# Patient Record
Sex: Female | Born: 1970 | Race: Black or African American | Hispanic: No | Marital: Married | Smoking: Former smoker
Health system: Southern US, Community
[De-identification: ages and names within clinical notes are randomized; demographics above are authoritative.]

## PROBLEM LIST (undated history)

## (undated) DIAGNOSIS — I5021 Acute systolic (congestive) heart failure: Secondary | ICD-10-CM

## (undated) DIAGNOSIS — Z720 Tobacco use: Secondary | ICD-10-CM

## (undated) DIAGNOSIS — F1010 Alcohol abuse, uncomplicated: Secondary | ICD-10-CM

---

## 2005-04-23 ENCOUNTER — Ambulatory Visit: Payer: Self-pay | Admitting: Family Medicine

## 2005-05-07 ENCOUNTER — Other Ambulatory Visit: Admission: RE | Admit: 2005-05-07 | Discharge: 2005-05-07 | Payer: Self-pay | Admitting: Family Medicine

## 2005-05-07 ENCOUNTER — Ambulatory Visit: Payer: Self-pay | Admitting: Family Medicine

## 2005-08-12 ENCOUNTER — Ambulatory Visit: Payer: Self-pay | Admitting: Family Medicine

## 2019-06-16 DIAGNOSIS — I426 Alcoholic cardiomyopathy: Secondary | ICD-10-CM

## 2019-06-16 DIAGNOSIS — I5021 Acute systolic (congestive) heart failure: Secondary | ICD-10-CM | POA: Diagnosis not present

## 2019-06-16 DIAGNOSIS — I340 Nonrheumatic mitral (valve) insufficiency: Secondary | ICD-10-CM

## 2019-06-16 DIAGNOSIS — I509 Heart failure, unspecified: Secondary | ICD-10-CM

## 2019-06-16 DIAGNOSIS — I110 Hypertensive heart disease with heart failure: Secondary | ICD-10-CM

## 2019-06-16 DIAGNOSIS — I361 Nonrheumatic tricuspid (valve) insufficiency: Secondary | ICD-10-CM

## 2019-06-16 DIAGNOSIS — K7010 Alcoholic hepatitis without ascites: Secondary | ICD-10-CM

## 2019-06-17 DIAGNOSIS — I10 Essential (primary) hypertension: Secondary | ICD-10-CM | POA: Diagnosis not present

## 2019-06-17 DIAGNOSIS — I509 Heart failure, unspecified: Secondary | ICD-10-CM

## 2019-06-17 DIAGNOSIS — I426 Alcoholic cardiomyopathy: Secondary | ICD-10-CM

## 2019-06-17 DIAGNOSIS — I110 Hypertensive heart disease with heart failure: Secondary | ICD-10-CM | POA: Diagnosis not present

## 2019-06-17 DIAGNOSIS — I5021 Acute systolic (congestive) heart failure: Secondary | ICD-10-CM | POA: Diagnosis not present

## 2019-06-18 ENCOUNTER — Inpatient Hospital Stay (HOSPITAL_COMMUNITY)
Admission: AD | Admit: 2019-06-18 | Discharge: 2019-06-22 | DRG: 287 | Disposition: A | Discharge status: Home / Self Care | Payer: Commercial Managed Care - PPO | Source: Other Acute Inpatient Hospital | Attending: Internal Medicine | Admitting: Internal Medicine

## 2019-06-18 ENCOUNTER — Inpatient Hospital Stay: Payer: Self-pay

## 2019-06-18 ENCOUNTER — Encounter (HOSPITAL_COMMUNITY): Payer: Self-pay | Admitting: Internal Medicine

## 2019-06-18 DIAGNOSIS — I5021 Acute systolic (congestive) heart failure: Secondary | ICD-10-CM | POA: Diagnosis not present

## 2019-06-18 DIAGNOSIS — I10 Essential (primary) hypertension: Secondary | ICD-10-CM

## 2019-06-18 DIAGNOSIS — F1021 Alcohol dependence, in remission: Secondary | ICD-10-CM | POA: Diagnosis present

## 2019-06-18 DIAGNOSIS — Z23 Encounter for immunization: Secondary | ICD-10-CM | POA: Diagnosis not present

## 2019-06-18 DIAGNOSIS — I426 Alcoholic cardiomyopathy: Secondary | ICD-10-CM

## 2019-06-18 DIAGNOSIS — Z20828 Contact with and (suspected) exposure to other viral communicable diseases: Secondary | ICD-10-CM | POA: Diagnosis present

## 2019-06-18 DIAGNOSIS — I110 Hypertensive heart disease with heart failure: Secondary | ICD-10-CM | POA: Diagnosis present

## 2019-06-18 DIAGNOSIS — I428 Other cardiomyopathies: Secondary | ICD-10-CM | POA: Diagnosis present

## 2019-06-18 DIAGNOSIS — I340 Nonrheumatic mitral (valve) insufficiency: Secondary | ICD-10-CM | POA: Diagnosis not present

## 2019-06-18 DIAGNOSIS — I509 Heart failure, unspecified: Secondary | ICD-10-CM

## 2019-06-18 DIAGNOSIS — I361 Nonrheumatic tricuspid (valve) insufficiency: Secondary | ICD-10-CM | POA: Diagnosis not present

## 2019-06-18 DIAGNOSIS — Z87891 Personal history of nicotine dependence: Secondary | ICD-10-CM

## 2019-06-18 DIAGNOSIS — R188 Other ascites: Secondary | ICD-10-CM | POA: Diagnosis present

## 2019-06-18 DIAGNOSIS — I5023 Acute on chronic systolic (congestive) heart failure: Secondary | ICD-10-CM | POA: Diagnosis present

## 2019-06-18 DIAGNOSIS — R0602 Shortness of breath: Secondary | ICD-10-CM | POA: Diagnosis present

## 2019-06-18 HISTORY — DX: Acute systolic (congestive) heart failure: I50.21

## 2019-06-18 HISTORY — DX: Tobacco use: Z72.0

## 2019-06-18 HISTORY — DX: Alcohol abuse, uncomplicated: F10.10

## 2019-06-18 LAB — CBC
HCT: 45.8 % (ref 36.0–46.0)
Hemoglobin: 15.3 g/dL — ABNORMAL HIGH (ref 12.0–15.0)
MCH: 30.5 pg (ref 26.0–34.0)
MCHC: 33.4 g/dL (ref 30.0–36.0)
MCV: 91.4 fL (ref 80.0–100.0)
Platelets: 293 10*3/uL (ref 150–400)
RBC: 5.01 MIL/uL (ref 3.87–5.11)
RDW: 14.6 % (ref 11.5–15.5)
WBC: 9.3 10*3/uL (ref 4.0–10.5)
nRBC: 0 % (ref 0.0–0.2)

## 2019-06-18 LAB — TSH: TSH: 0.933 u[IU]/mL (ref 0.350–4.500)

## 2019-06-18 LAB — BRAIN NATRIURETIC PEPTIDE: B Natriuretic Peptide: 2160.9 pg/mL — ABNORMAL HIGH (ref 0.0–100.0)

## 2019-06-18 LAB — BASIC METABOLIC PANEL
Anion gap: 10 (ref 5–15)
BUN: 21 mg/dL — ABNORMAL HIGH (ref 6–20)
CO2: 22 mmol/L (ref 22–32)
Calcium: 8.9 mg/dL (ref 8.9–10.3)
Chloride: 107 mmol/L (ref 98–111)
Creatinine, Ser: 1.05 mg/dL — ABNORMAL HIGH (ref 0.44–1.00)
GFR calc Af Amer: >60 mL/min (ref >60)
GFR calc non Af Amer: >60 mL/min (ref >60)
Glucose, Bld: 111 mg/dL — ABNORMAL HIGH (ref 70–99)
Potassium: 3.7 mmol/L (ref 3.5–5.1)
Sodium: 139 mmol/L (ref 135–145)

## 2019-06-18 LAB — MAGNESIUM: Magnesium: 1.9 mg/dL (ref 1.7–2.4)

## 2019-06-18 LAB — MRSA PCR SCREENING: MRSA by PCR: NEGATIVE

## 2019-06-18 MED ORDER — SODIUM CHLORIDE 0.9% FLUSH: 10.0000 mL | INTRAVENOUS | Status: DC | PRN

## 2019-06-18 MED ORDER — SODIUM CHLORIDE 0.9% FLUSH: 3.0000 mL | Freq: Two times a day (BID) | INTRAVENOUS | Status: DC

## 2019-06-18 MED ORDER — ASPIRIN 81 MG PO CHEW
81.0000 mg | CHEWABLE_TABLET | ORAL | Status: AC
  Administered 2019-06-19: 81 mg via ORAL
  Filled 2019-06-18: qty 1

## 2019-06-18 MED ORDER — FUROSEMIDE 10 MG/ML IJ SOLN
60.0000 mg | Freq: Two times a day (BID) | INTRAMUSCULAR | Status: DC
  Administered 2019-06-18 – 2019-06-19 (×3): 60 mg via INTRAVENOUS
  Filled 2019-06-18 (×3): qty 6

## 2019-06-18 MED ORDER — ADULT MULTIVITAMIN W/MINERALS CH
1.0000 | ORAL_TABLET | Freq: Every day | ORAL | Status: DC
  Administered 2019-06-19 – 2019-06-22 (×4): 1 via ORAL
  Filled 2019-06-18 (×4): qty 1

## 2019-06-18 MED ORDER — SODIUM CHLORIDE 0.9 % IV SOLN: 250.0000 mL | INTRAVENOUS | Status: DC | PRN

## 2019-06-18 MED ORDER — ENOXAPARIN SODIUM 40 MG/0.4ML ~~LOC~~ SOLN
40.0000 mg | SUBCUTANEOUS | Status: DC
  Administered 2019-06-18 – 2019-06-19 (×2): 40 mg via SUBCUTANEOUS
  Filled 2019-06-18 (×2): qty 0.4

## 2019-06-18 MED ORDER — SPIRONOLACTONE 25 MG PO TABS
25.0000 mg | ORAL_TABLET | Freq: Every day | ORAL | Status: DC
  Administered 2019-06-19 – 2019-06-22 (×4): 25 mg via ORAL
  Filled 2019-06-18 (×4): qty 1

## 2019-06-18 MED ORDER — SODIUM CHLORIDE 0.9% FLUSH
10.0000 mL | Freq: Two times a day (BID) | INTRAVENOUS | Status: DC
  Administered 2019-06-18: 30 mL
  Administered 2019-06-19 – 2019-06-20 (×2): 10 mL
  Administered 2019-06-20: 30 mL
  Administered 2019-06-21: 10 mL
  Administered 2019-06-21 – 2019-06-22 (×2): 30 mL

## 2019-06-18 MED ORDER — CHLORHEXIDINE GLUCONATE CLOTH 2 % EX PADS
6.0000 | MEDICATED_PAD | Freq: Every day | CUTANEOUS | Status: DC
  Administered 2019-06-18 – 2019-06-19 (×2): 6 via TOPICAL

## 2019-06-18 MED ORDER — SACUBITRIL-VALSARTAN 24-26 MG PO TABS
1.0000 | ORAL_TABLET | Freq: Two times a day (BID) | ORAL | Status: DC
  Administered 2019-06-18: 1 via ORAL
  Filled 2019-06-18 (×3): qty 1

## 2019-06-18 MED ORDER — SODIUM CHLORIDE 0.9 % IV SOLN: INTRAVENOUS | Status: DC

## 2019-06-18 MED ORDER — ACETAMINOPHEN 325 MG PO TABS
650.0000 mg | ORAL_TABLET | ORAL | Status: DC | PRN
  Administered 2019-06-19: 650 mg via ORAL
  Filled 2019-06-18: qty 2

## 2019-06-18 MED ORDER — SODIUM CHLORIDE 0.9% FLUSH: 3.0000 mL | INTRAVENOUS | Status: DC | PRN

## 2019-06-18 MED ORDER — ONDANSETRON HCL 4 MG/2ML IJ SOLN: 4.0000 mg | Freq: Four times a day (QID) | INTRAMUSCULAR | Status: DC | PRN

## 2019-06-18 MED ORDER — DIGOXIN 125 MCG PO TABS
0.1250 mg | ORAL_TABLET | Freq: Every day | ORAL | Status: DC
  Administered 2019-06-19 – 2019-06-22 (×4): 0.125 mg via ORAL
  Filled 2019-06-18 (×4): qty 1

## 2019-06-18 MED ORDER — ASPIRIN EC 81 MG PO TBEC
81.0000 mg | DELAYED_RELEASE_TABLET | Freq: Every day | ORAL | Status: DC
  Administered 2019-06-19 – 2019-06-21 (×3): 81 mg via ORAL
  Filled 2019-06-18 (×4): qty 1

## 2019-06-18 NOTE — Progress Notes (Signed)
Peripherally Inserted Central Catheter/Midline Placement  The IV Nurse has discussed with the patient and/or persons authorized to consent for the patient, the purpose of this procedure and the potential benefits and risks involved with this procedure.  The benefits include less needle sticks, lab draws from the catheter, and the patient may be discharged home with the catheter. Risks include, but not limited to, infection, bleeding, blood clot (thrombus formation), and puncture of an artery; nerve damage and irregular heartbeat and possibility to perform a PICC exchange if needed/ordered by physician.  Alternatives to this procedure were also discussed.  Bard Power PICC patient education guide, fact sheet on infection prevention and patient information card has been provided to patient /or left at bedside.    PICC/Midline Placement Documentation  PICC Triple Lumen 06/18/19 PICC Right Brachial 39 cm 2 cm (Active)  Indication for Insertion or Continuance of Line Vasoactive infusions 06/18/19 2032  Exposed Catheter (cm) 0 cm 06/18/19 2032  Site Assessment Clean;Dry;Intact 06/18/19 2032  Lumen #1 Status Flushed;Saline locked;Blood return noted 06/18/19 2032  Lumen #2 Status Flushed;Saline locked;Blood return noted 06/18/19 2032  Lumen #3 Status Flushed;Saline locked;Blood return noted 06/18/19 2032  Dressing Type Transparent 06/18/19 2032  Dressing Status Clean;Dry;Intact;Antimicrobial disc in place 06/18/19 2032  Dressing Change Due 06/25/19 06/18/19 2032       Gordan Payment 06/18/2019, 8:33 PM

## 2019-06-18 NOTE — H&P (Addendum)
Advanced HF Team Admission History and Physical:   Patient ID: Kari Bishop Oesterling MRN: [id number]; DOB: November 28, 1971   Admission date: 06/18/2019  Primary Care Provider: Patient, No Pcp Per Primary Cardiologist: [facility] to [facility] (Dr. Gala RomneyBensimhon) Primary Electrophysiologist:  None   Chief Complaint: shortness of breath, bilateral lower extremity edema, and abdominal swelling  Patient Profile:   Kari Bishop Grissett is a 48 y.o. female with no known past medical history who is transferred from [facility] ED with newly diagnosed acute systolic CHF.   History of Present Illness:   Ms. Kari HarpsHernandez is a 48 year old African-American female with no known past medical history. Patient has not seen a medical provider in 5 years and takes no medications at home.   Over last 3 week has had LE edema, progressive DOE with orthopnea and PND. Went to Urgent Care and [facility] 7[facility].   In the [facility]: EKG showed normal sinus rhythm, rate 98 bpm, with possible left atrial enlargement. Chest x-ray showed enlargement of cardiopericardial silhouette compared to 06/2013 which could represent cardiomegaly or pericardial effusion as well as a suspected small right pleural effusion. CT of abdomen/pelvis showed evidence of third spacing of fluid with small right pleural effusion, small volume ascites and anasarca presumably due to CHF .  WBC 8.6, Hgb 15.6, Plts 241. Na 137, K 4.3, Glucose 96, Scr 0.70. Echo showed EF 15% with mildly dilated LV.   Treated with IV lasix with good diuresis. Transferred here for further w/u.  Says she has smoked cigarettes and drank a 6-pack of beer qod for 8 years. Quit in May 2020. Denies known HTN or drug use. No recent viral illnesses/   Past Medical History:  Diagnosis Date  . Acute systolic (congestive) heart failure (HCC)    Onset 06/2019  . Alcohol abuse    6 beers every other day x 8 years   . Tobacco abuse       Medications Prior to Admission: None prior to  admission  Transfer Meds:  Cleda DaubSpiro 25 daily Carvedilol 3.125 bid Losartan 25 daily Lasix IV Lovenonx Thiamine Folic acid MVI  Allergies:   NKDA  Social History:   Social History   Socioeconomic History  . Marital status: Married    Spouse name: Not on file  . Number of children: Not on file  . Years of education: Not on file  . Highest education level: Not on file  Occupational History  . Not on file  Social Needs  . Financial resource strain: Not on file  . Food insecurity    Worry: Not on file    Inability: Not on file  . Transportation needs    Medical: Not on file    Non-medical: Not on file  Tobacco Use  . Smoking status: Not on file  Substance and Sexual Activity  . Alcohol use: Not on file  . Drug use: Not on file  . Sexual activity: Not on file  Lifestyle  . Physical activity    Days per week: Not on file    Minutes per session: Not on file  . Stress: Not on file  Relationships  . Social Musicianconnections    Talks on phone: Not on file    Gets together: Not on file    Attends religious service: Not on file    Active member of club or organization: Not on file    Attends meetings of clubs or organizations: Not on file    Relationship status:  Not on file  . Intimate partner violence    Fear of current or ex partner: Not on file    Emotionally abused: Not on file    Physically abused: Not on file    Forced sexual activity: Not on file  Other Topics Concern  . Not on file  Social History Narrative  . Not on file    Family History:   Denies FHX of HD or premature CAD.   Review of Systems: [y] = yes, [ ]  = no    General: Weight gain [y]; Weight loss [ ] ; Anorexia [ ] ; Fatigue [y]; Fever [ ] ; Chills [ ] ; Weakness Cove.Etienne ]   Cardiac: Chest pain/pressure [ ] ; Resting SOB [ y]; Exertional SOB [y]; Orthopnea Cove.Etienne ]; Pedal Edema [y]; Palpitations [ ] ; Syncope [ ] ; Presyncope [ ] ; Paroxysmal nocturnal dyspnea[y ]   Pulmonary: Cough [ ] ; Wheezing[ ] ;  Hemoptysis[ ] ; Sputum [ ] ; Snoring [ ]    GI: Vomiting[ ] ; Dysphagia[ ] ; Melena[ ] ; Hematochezia [ ] ; Heartburn[ ] ; Abdominal pain [ ] ; Constipation [ ] ; Diarrhea [ ] ; BRBPR [ ]    GU: Hematuria[ ] ; Dysuria [ ] ; Nocturia[ ]   Vascular: Pain in legs with walking [ ] ; Pain in feet with lying flat [ ] ; Non-healing sores [ ] ; Stroke [ ] ; TIA [ ] ; Slurred speech [ ] ;   Neuro: Headaches[ ] ; Vertigo[ ] ; Seizures[ ] ; Paresthesias[ ] ;Blurred vision [ ] ; Diplopia [ ] ; Vision changes [ ]    Ortho/Skin: Arthritis [y]; Joint pain [y]; Muscle pain [ ] ; Joint swelling [ ] ; Back Pain [ ] ; Rash [ ]    Psych: Depression[ ] ; Anxiety[ ]    Heme: Bleeding problems [ ] ; Clotting disorders [ ] ; Anemia [ ]    Endocrine: Diabetes [ ] ; Thyroid dysfunction[ ]    Physical Exam/Data:   Vitals:   06/18/19 1813  Temp: 98.2 F (36.8 C)     BP 120/100 HR 80-90s  General:  Sitting up in bed. No resp difficulty HEENT: normal Neck: supple. JVP 10-12 with prominent CV waves. Carotids 2+ bilat; no bruits. No lymphadenopathy or thryomegaly appreciated. Cor: PMI nondisplaced. Regular rate & rhythm. No rubs, gallops or murmurs. Lungs: clear dull R base Abdomen: soft, nontender, + distended. No hepatosplenomegaly. No bruits or masses. Good bowel sounds. Extremities: no cyanosis, clubbing, rash, 2+ edema cool Neuro: alert & orientedx3, cranial nerves grossly intact. moves all 4 extremities w/o difficulty. Affect pleasant   EKG:  NSR 98 LAE. Possible anterior Q waves. QRS 76 ms. Personally reviewed    Laboratory Data:  High Sensitivity Troponin:  No results for input(s): TROPONINIHS in the last 720 hours.    Cardiac EnzymesNo results for input(s): TROPONINI in the last 168 hours. No results for input(s): TROPIPOC in the last 168 hours.  ChemistryNo results for input(s): NA, K, CL, CO2, GLUCOSE, BUN, CREATININE, CALCIUM, GFRNONAA, GFRAA, ANIONGAP in the last 168 hours.  No results for input(s): PROT, ALBUMIN, AST,  ALT, ALKPHOS, BILITOT in the last 168 hours. HematologyNo results for input(s): WBC, RBC, HGB, HCT, MCV, MCH, MCHC, RDW, PLT in the last 168 hours. BNPNo results for input(s): BNP, PROBNP in the last 168 hours.  DDimer No results for input(s): DDIMER in the last 168 hours.   Radiology/Studies:  No results found.  Assessment and Plan:   1. Acute systolic HF - echo at [facility] 7/6 showed EF 15% with mild RV dysfunction  - etiology unclear. Suspect possible NICM (ETOH, HTN?) but ECG with poor anterior forces -  remains volume overloaded and cold on exam - continue IV lasix 60mg  IV bid - spiro 25mg  daily - Entresto 24/26 bid (stop losartan) - digoxin 0.125 - hold carvedilol for now with possible low output - Repeat echo - Plan cath +/- cMRI later in week once tuned up  2. H/o ETOH abuse - quit in May - no h/o DTs     Severity of Illness: The appropriate patient status for this patient is INPATIENT. Inpatient status is judged to be reasonable and necessary in order to provide the required intensity of service to ensure the patient's safety. The patient's presenting symptoms, physical exam findings, and initial radiographic and laboratory data in the context of their chronic comorbidities is felt to place them at high risk for further clinical deterioration. Furthermore, it is not anticipated that the patient will be medically stable for discharge from the hospital within 2 midnights of admission. The following factors support the patient status of inpatient.   " The patient's presenting symptoms include acute HF. " The worrisome physical exam findings include peripheral edema. " The initial radiographic and laboratory data are worrisome because of acute HF. " The chronic co-morbidities include h/o ETOH abuse   * I certify that at the point of admission it is my clinical judgment that the patient will require inpatient hospital care spanning beyond 2 midnights from the point of  admission due to high intensity of service, high risk for further deterioration and high frequency of surveillance required.*    For questions or updates, please contact [place] Please consult [email or web address] for contact info under     Glori Bickers, MD  6:51 PM

## 2019-06-19 ENCOUNTER — Inpatient Hospital Stay (HOSPITAL_COMMUNITY): Payer: Commercial Managed Care - PPO

## 2019-06-19 ENCOUNTER — Encounter (HOSPITAL_COMMUNITY): Payer: Self-pay | Admitting: Emergency Medicine

## 2019-06-19 ENCOUNTER — Other Ambulatory Visit: Payer: Self-pay

## 2019-06-19 ENCOUNTER — Ambulatory Visit (HOSPITAL_COMMUNITY): Admission: RE | Admit: 2019-06-19 | Payer: Self-pay | Source: Home / Self Care | Admitting: Internal Medicine

## 2019-06-19 DIAGNOSIS — I361 Nonrheumatic tricuspid (valve) insufficiency: Secondary | ICD-10-CM

## 2019-06-19 DIAGNOSIS — I340 Nonrheumatic mitral (valve) insufficiency: Secondary | ICD-10-CM

## 2019-06-19 LAB — COOXEMETRY PANEL
Carboxyhemoglobin: 0.9 % (ref 0.5–1.5)
Carboxyhemoglobin: 1.3 % (ref 0.5–1.5)
Methemoglobin: 0.7 % (ref 0.0–1.5)
Methemoglobin: 0.8 % (ref 0.0–1.5)
O2 Saturation: 63.3 %
O2 Saturation: 72 %
Total hemoglobin: 12.9 g/dL (ref 12.0–16.0)
Total hemoglobin: 15.7 g/dL (ref 12.0–16.0)

## 2019-06-19 LAB — BASIC METABOLIC PANEL
Anion gap: 11 (ref 5–15)
Anion gap: 13 (ref 5–15)
BUN: 15 mg/dL (ref 6–20)
BUN: 19 mg/dL (ref 6–20)
CO2: 26 mmol/L (ref 22–32)
CO2: 26 mmol/L (ref 22–32)
Calcium: 8.9 mg/dL (ref 8.9–10.3)
Calcium: 9.4 mg/dL (ref 8.9–10.3)
Chloride: 102 mmol/L (ref 98–111)
Chloride: 106 mmol/L (ref 98–111)
Creatinine, Ser: 0.92 mg/dL (ref 0.44–1.00)
Creatinine, Ser: 1.5 mg/dL — ABNORMAL HIGH (ref 0.44–1.00)
GFR calc Af Amer: 47 mL/min — ABNORMAL LOW (ref >60)
GFR calc Af Amer: >60 mL/min (ref >60)
GFR calc non Af Amer: 41 mL/min — ABNORMAL LOW (ref >60)
GFR calc non Af Amer: >60 mL/min (ref >60)
Glucose, Bld: 102 mg/dL — ABNORMAL HIGH (ref 70–99)
Glucose, Bld: 126 mg/dL — ABNORMAL HIGH (ref 70–99)
Potassium: 3.4 mmol/L — ABNORMAL LOW (ref 3.5–5.1)
Potassium: 3.8 mmol/L (ref 3.5–5.1)
Sodium: 141 mmol/L (ref 135–145)
Sodium: 143 mmol/L (ref 135–145)

## 2019-06-19 LAB — ECHOCARDIOGRAM COMPLETE
Height: 65 in
Weight: 2518.54 oz

## 2019-06-19 LAB — HIV ANTIBODY (ROUTINE TESTING W REFLEX): HIV Screen 4th Generation wRfx: NONREACTIVE

## 2019-06-19 LAB — HEPATITIS PANEL, ACUTE
HCV Ab: <0.1 s/co ratio (ref 0.0–0.9)
Hep A IgM: NEGATIVE
Hep B C IgM: NEGATIVE
Hepatitis B Surface Ag: NEGATIVE

## 2019-06-19 LAB — SARS CORONAVIRUS 2 BY RT PCR (HOSPITAL ORDER, PERFORMED IN ~~LOC~~ HOSPITAL LAB): SARS Coronavirus 2: NEGATIVE

## 2019-06-19 MED ORDER — POTASSIUM CHLORIDE CRYS ER 20 MEQ PO TBCR
20.0000 meq | EXTENDED_RELEASE_TABLET | Freq: Two times a day (BID) | ORAL | Status: DC
  Administered 2019-06-19 – 2019-06-20 (×3): 20 meq via ORAL
  Filled 2019-06-19 (×3): qty 1

## 2019-06-19 MED ORDER — CHLORHEXIDINE GLUCONATE CLOTH 2 % EX PADS
6.0000 | MEDICATED_PAD | Freq: Every day | CUTANEOUS | Status: DC
  Administered 2019-06-19 – 2019-06-22 (×3): 6 via TOPICAL

## 2019-06-19 MED ORDER — CHLORHEXIDINE GLUCONATE CLOTH 2 % EX PADS
6.0000 | MEDICATED_PAD | Freq: Every day | CUTANEOUS | Status: DC
  Administered 2019-06-19: 13:00:00 6 via TOPICAL

## 2019-06-19 MED ORDER — POTASSIUM CHLORIDE 10 MEQ/50ML IV SOLN
10.0000 meq | INTRAVENOUS | Status: AC
  Administered 2019-06-19 (×4): 10 meq via INTRAVENOUS
  Filled 2019-06-19 (×2): qty 50

## 2019-06-19 MED ORDER — SACUBITRIL-VALSARTAN 49-51 MG PO TABS
1.0000 | ORAL_TABLET | Freq: Two times a day (BID) | ORAL | Status: DC
  Administered 2019-06-19 – 2019-06-21 (×5): 1 via ORAL
  Filled 2019-06-19 (×6): qty 1

## 2019-06-19 NOTE — Progress Notes (Signed)
I responded to a [place] to provide Advance Directive information for the patient. I visited the patient's room and found her resting. I left a revised copy of the AD at the nurse's station and will be available at another time to provide additional support.    06/19/19 1000  Clinical Encounter Type  Visited With Patient not available  Visit Type Spiritual support  Referral From Nurse  Consult/Referral To Chaplain  Spiritual Encounters  Spiritual Needs Prayer;Literature    Chaplain Dr Redgie Grayer

## 2019-06-19 NOTE — Progress Notes (Signed)
Echocardiogram 2D Echocardiogram has been performed.  Oneal Deputy Ahmarion Saraceno 06/19/2019, 3:52 PM

## 2019-06-19 NOTE — Progress Notes (Signed)
Advanced Heart Failure Rounding Note   Subjective:    Feeling better. Diuresing well. Still weak. Mild DOE. No orthopnea or PND.   PICC placed co-ox 63% -> 72%  CVP 18.  Creatinine seems to be normalizing   Objective:   Weight Range:  Vital Signs:   Temp:  [97.7 F (36.5 C)-99 F (37.2 C)] 97.7 F (36.5 C) (07/07 0734) Pulse Rate:  [69-93] 86 (07/07 0800) Resp:  [14-34] 30 (07/07 0800) BP: (125-143)/(100-110) 135/106 (07/07 0800) SpO2:  [90 %-100 %] 96 % (07/07 0800) Weight:  [71.4 kg] 71.4 kg (07/07 0400) Last BM Date: 06/18/19  Weight change: Filed Weights   06/19/19 0400  Weight: 71.4 kg    Intake/Output:   Intake/Output Summary (Last 24 hours) at 06/19/2019 0832 Last data filed at 06/19/2019 0800 Gross per 24 hour  Intake 911.47 ml  Output 3900 ml  Net -2988.53 ml     Physical Exam: General:  Sitting up in bed. No resp difficulty HEENT: normal Neck: supple. JVP to ear . Carotids 2+ bilat; no bruits. No lymphadenopathy or thryomegaly appreciated. Cor: PMI nondisplaced. Regular rate & rhythm. No rubs, gallops or murmurs. + s2 Lungs: clear dull R base Abdomen: soft, nontender, nondistended. No hepatosplenomegaly. No bruits or masses. Good bowel sounds. Extremities: no cyanosis, clubbing, rash, 2+ edema Neuro: alert & orientedx3, cranial nerves grossly intact. moves all 4 extremities w/o difficulty. Affect pleasant  Telemetry: Sinus 80s Personally reviewed   Labs: Basic Metabolic Panel: Recent Labs  Lab 06/18/19 1924 06/19/19 0015 06/19/19 0600  NA 139 141 143  K 3.7 3.4* 3.8  CL 107 102 106  CO2 22 26 26   GLUCOSE 111* 126* 102*  BUN 21* 19 15  CREATININE 1.05* 1.50* 0.92  CALCIUM 8.9 9.4 8.9  MG 1.9  --   --     Liver Function Tests: No results for input(s): AST, ALT, ALKPHOS, BILITOT, PROT, ALBUMIN in the last 168 hours. No results for input(s): LIPASE, AMYLASE in the last 168 hours. No results for input(s): AMMONIA in the last 168 hours.   CBC: Recent Labs  Lab 06/18/19 1924  WBC 9.3  HGB 15.3*  HCT 45.8  MCV 91.4  PLT 293    Cardiac Enzymes: No results for input(s): CKTOTAL, CKMB, CKMBINDEX, TROPONINI in the last 168 hours.  BNP: BNP (last 3 results) Recent Labs    06/18/19 1924  BNP 2,160.9*    ProBNP (last 3 results) No results for input(s): PROBNP in the last 8760 hours.    Other results:  Imaging: [place] Ekg Site Rite  Result Date: 06/18/2019 If Site Rite image not attached, placement could not be confirmed due to current cardiac rhythm.     Medications:     Scheduled Medications: . aspirin EC  81 mg Oral Daily  . Chlorhexidine Gluconate Cloth  6 each Topical Daily  . Chlorhexidine Gluconate Cloth  6 each Topical Daily  . digoxin  0.125 mg Oral Daily  . enoxaparin (LOVENOX) injection  40 mg Subcutaneous Q24H  . furosemide  60 mg Intravenous BID  . multivitamin with minerals  1 tablet Oral Daily  . sacubitril-valsartan  1 tablet Oral BID  . sodium chloride flush  10-40 mL Intracatheter Q12H  . sodium chloride flush  3 mL Intravenous Q12H  . sodium chloride flush  3 mL Intravenous Q12H  . spironolactone  25 mg Oral Daily     Infusions: . sodium chloride    . sodium chloride    .  sodium chloride       PRN Medications:  sodium chloride, sodium chloride, acetaminophen, ondansetron (ZOFRAN) IV, sodium chloride flush, sodium chloride flush, sodium chloride flush   Assessment/Plan:   1. Acute systolic HF - echo at [facility] 7/6 showed EF 15% with mild RV dysfunction  - etiology unclear. Suspect possible NICM (ETOH, HTN?) but ECG with poor anterior forces - remains volume overloaded CVP 18. Continue lasix 80 bid. Can increase as needed - continue IV lasix 60mg  IV bid - spiro 25mg  daily - Increase Entresto to 49/51 bid - digoxin 0.125 - hold carvedilol for now with possible low output - Repeat echo - Plan cath +/- cMRI later in week once tuned up  2. H/o ETOH abuse - quit in  May - no h/o DTs  3. HTN - increase Entresto  Can got to [facility]   Length of Stay: 1   Arvilla Meres MD 06/19/2019, 8:32 AM  Advanced Heart Failure Team Pager [phone] (M-F; 7a - 4p)  Please contact [facility] for night-coverage after hours (4p -7a ) and weekends on [email or web address]

## 2019-06-20 ENCOUNTER — Encounter (HOSPITAL_COMMUNITY): Payer: Self-pay | Admitting: Internal Medicine

## 2019-06-20 ENCOUNTER — Encounter (HOSPITAL_COMMUNITY)
Admission: AD | Disposition: A | Discharge status: Home / Self Care | Payer: Self-pay | Source: Other Acute Inpatient Hospital | Attending: Internal Medicine

## 2019-06-20 HISTORY — PX: RIGHT/LEFT HEART CATH AND CORONARY ANGIOGRAPHY: CATH118266

## 2019-06-20 LAB — CREATININE, SERUM
Creatinine, Ser: 0.87 mg/dL (ref 0.44–1.00)
GFR calc Af Amer: >60 mL/min (ref >60)
GFR calc non Af Amer: >60 mL/min (ref >60)

## 2019-06-20 LAB — POCT I-STAT 7, (LYTES, BLD GAS, ICA,H+H)
Acid-Base Excess: 1 mmol/L (ref 0.0–2.0)
Bicarbonate: 25.9 mmol/L (ref 20.0–28.0)
Calcium, Ion: 1.1 mmol/L — ABNORMAL LOW (ref 1.15–1.40)
HCT: 54 % — ABNORMAL HIGH (ref 36.0–46.0)
Hemoglobin: 18.4 g/dL — ABNORMAL HIGH (ref 12.0–15.0)
O2 Saturation: 97 %
Potassium: 3.8 mmol/L (ref 3.5–5.1)
Sodium: 143 mmol/L (ref 135–145)
TCO2: 27 mmol/L (ref 22–32)
pCO2 arterial: 39.8 mmHg (ref 32.0–48.0)
pH, Arterial: 7.421 (ref 7.350–7.450)
pO2, Arterial: 86 mmHg (ref 83.0–108.0)

## 2019-06-20 LAB — POCT I-STAT EG7
Acid-Base Excess: 2 mmol/L (ref 0.0–2.0)
Acid-Base Excess: 2 mmol/L (ref 0.0–2.0)
Bicarbonate: 26.8 mmol/L (ref 20.0–28.0)
Bicarbonate: 27.5 mmol/L (ref 20.0–28.0)
Calcium, Ion: 1.1 mmol/L — ABNORMAL LOW (ref 1.15–1.40)
Calcium, Ion: 1.19 mmol/L (ref 1.15–1.40)
HCT: 55 % — ABNORMAL HIGH (ref 36.0–46.0)
HCT: 56 % — ABNORMAL HIGH (ref 36.0–46.0)
Hemoglobin: 18.7 g/dL — ABNORMAL HIGH (ref 12.0–15.0)
Hemoglobin: 19 g/dL — ABNORMAL HIGH (ref 12.0–15.0)
O2 Saturation: 71 %
O2 Saturation: 71 %
Potassium: 3.8 mmol/L (ref 3.5–5.1)
Potassium: 4.1 mmol/L (ref 3.5–5.1)
Sodium: 142 mmol/L (ref 135–145)
Sodium: 144 mmol/L (ref 135–145)
TCO2: 28 mmol/L (ref 22–32)
TCO2: 29 mmol/L (ref 22–32)
pCO2, Ven: 41.2 mmHg — ABNORMAL LOW (ref 44.0–60.0)
pCO2, Ven: 42.5 mmHg — ABNORMAL LOW (ref 44.0–60.0)
pH, Ven: 7.419 (ref 7.250–7.430)
pH, Ven: 7.42 (ref 7.250–7.430)
pO2, Ven: 36 mmHg (ref 32.0–45.0)
pO2, Ven: 37 mmHg (ref 32.0–45.0)

## 2019-06-20 LAB — BASIC METABOLIC PANEL
Anion gap: 11 (ref 5–15)
BUN: 16 mg/dL (ref 6–20)
CO2: 27 mmol/L (ref 22–32)
Calcium: 9.3 mg/dL (ref 8.9–10.3)
Chloride: 101 mmol/L (ref 98–111)
Creatinine, Ser: 1.11 mg/dL — ABNORMAL HIGH (ref 0.44–1.00)
GFR calc Af Amer: >60 mL/min (ref >60)
GFR calc non Af Amer: 59 mL/min — ABNORMAL LOW (ref >60)
Glucose, Bld: 92 mg/dL (ref 70–99)
Potassium: 4.2 mmol/L (ref 3.5–5.1)
Sodium: 139 mmol/L (ref 135–145)

## 2019-06-20 LAB — CBC
HCT: 49 % — ABNORMAL HIGH (ref 36.0–46.0)
Hemoglobin: 16.5 g/dL — ABNORMAL HIGH (ref 12.0–15.0)
MCH: 30.9 pg (ref 26.0–34.0)
MCHC: 33.7 g/dL (ref 30.0–36.0)
MCV: 91.8 fL (ref 80.0–100.0)
Platelets: 311 10*3/uL (ref 150–400)
RBC: 5.34 MIL/uL — ABNORMAL HIGH (ref 3.87–5.11)
RDW: 14.5 % (ref 11.5–15.5)
WBC: 8.7 10*3/uL (ref 4.0–10.5)
nRBC: 0 % (ref 0.0–0.2)

## 2019-06-20 LAB — COOXEMETRY PANEL
Carboxyhemoglobin: 0.7 % (ref 0.5–1.5)
Methemoglobin: 0.6 % (ref 0.0–1.5)
O2 Saturation: 55.4 %
Total hemoglobin: 18.1 g/dL — ABNORMAL HIGH (ref 12.0–16.0)

## 2019-06-20 SURGERY — RIGHT/LEFT HEART CATH AND CORONARY ANGIOGRAPHY
Anesthesia: LOCAL

## 2019-06-20 MED ORDER — VERAPAMIL HCL 2.5 MG/ML IV SOLN
INTRAVENOUS | Status: AC
  Filled 2019-06-20: qty 2

## 2019-06-20 MED ORDER — HYDRALAZINE HCL 20 MG/ML IJ SOLN: 10.0000 mg | INTRAMUSCULAR | Status: AC | PRN

## 2019-06-20 MED ORDER — HEPARIN (PORCINE) IN NACL 1000-0.9 UT/500ML-% IV SOLN
INTRAVENOUS | Status: AC
  Filled 2019-06-20: qty 1000

## 2019-06-20 MED ORDER — HEPARIN (PORCINE) IN NACL 1000-0.9 UT/500ML-% IV SOLN
INTRAVENOUS | Status: DC | PRN
  Administered 2019-06-20 (×2): 500 mL via INTRA_ARTERIAL

## 2019-06-20 MED ORDER — MIDAZOLAM HCL 2 MG/2ML IJ SOLN
INTRAMUSCULAR | Status: AC
  Filled 2019-06-20: qty 2

## 2019-06-20 MED ORDER — HEPARIN SODIUM (PORCINE) 1000 UNIT/ML IJ SOLN
INTRAMUSCULAR | Status: DC | PRN
  Administered 2019-06-20: 3500 [IU] via INTRAVENOUS

## 2019-06-20 MED ORDER — SODIUM CHLORIDE 0.9% FLUSH
3.0000 mL | Freq: Two times a day (BID) | INTRAVENOUS | Status: DC
  Administered 2019-06-20: 3 mL via INTRAVENOUS

## 2019-06-20 MED ORDER — IOHEXOL 350 MG/ML SOLN
INTRAVENOUS | Status: DC | PRN
  Administered 2019-06-20: 30 mL via INTRACARDIAC

## 2019-06-20 MED ORDER — FENTANYL CITRATE (PF) 100 MCG/2ML IJ SOLN
INTRAMUSCULAR | Status: AC
  Filled 2019-06-20: qty 2

## 2019-06-20 MED ORDER — HEPARIN SODIUM (PORCINE) 1000 UNIT/ML IJ SOLN
INTRAMUSCULAR | Status: AC
  Filled 2019-06-20: qty 1

## 2019-06-20 MED ORDER — LABETALOL HCL 5 MG/ML IV SOLN: 10.0000 mg | INTRAVENOUS | Status: AC | PRN

## 2019-06-20 MED ORDER — MIDAZOLAM HCL 2 MG/2ML IJ SOLN
INTRAMUSCULAR | Status: DC | PRN
  Administered 2019-06-20: 1 mg via INTRAVENOUS

## 2019-06-20 MED ORDER — VERAPAMIL HCL 2.5 MG/ML IV SOLN
INTRAVENOUS | Status: DC | PRN
  Administered 2019-06-20: 10 mL via INTRA_ARTERIAL

## 2019-06-20 MED ORDER — SODIUM CHLORIDE 0.9 % IV SOLN: 250.0000 mL | INTRAVENOUS | Status: DC | PRN

## 2019-06-20 MED ORDER — FENTANYL CITRATE (PF) 100 MCG/2ML IJ SOLN
INTRAMUSCULAR | Status: DC | PRN
  Administered 2019-06-20: 25 ug via INTRAVENOUS

## 2019-06-20 MED ORDER — LIDOCAINE HCL (PF) 1 % IJ SOLN
INTRAMUSCULAR | Status: AC
  Filled 2019-06-20: qty 30

## 2019-06-20 MED ORDER — ONDANSETRON HCL 4 MG/2ML IJ SOLN: 4.0000 mg | Freq: Four times a day (QID) | INTRAMUSCULAR | Status: DC | PRN

## 2019-06-20 MED ORDER — ENOXAPARIN SODIUM 40 MG/0.4ML ~~LOC~~ SOLN
40.0000 mg | SUBCUTANEOUS | Status: DC
  Administered 2019-06-21 – 2019-06-22 (×2): 40 mg via SUBCUTANEOUS
  Filled 2019-06-20 (×2): qty 0.4

## 2019-06-20 MED ORDER — SODIUM CHLORIDE 0.9% FLUSH: 3.0000 mL | INTRAVENOUS | Status: DC | PRN

## 2019-06-20 MED ORDER — LIDOCAINE HCL (PF) 1 % IJ SOLN
INTRAMUSCULAR | Status: DC | PRN
  Administered 2019-06-20 (×2): 2 mL via INTRADERMAL

## 2019-06-20 MED ORDER — ACETAMINOPHEN 325 MG PO TABS
650.0000 mg | ORAL_TABLET | ORAL | Status: DC | PRN
  Administered 2019-06-21: 650 mg via ORAL
  Filled 2019-06-20: qty 2

## 2019-06-20 MED ORDER — SODIUM CHLORIDE 0.9 % IV SOLN
INTRAVENOUS | Status: AC
  Administered 2019-06-20: 10:00:00 via INTRAVENOUS

## 2019-06-20 SURGICAL SUPPLY — 15 items
CATH 5FR JL3.5 JR4 ANG PIG MP (CATHETERS) ×1 IMPLANT
CATH BALLN WEDGE 5F 110CM (CATHETERS) ×1 IMPLANT
CATH LAUNCHER 5F JL3 (CATHETERS) IMPLANT
CATHETER LAUNCHER 5F JL3 (CATHETERS) ×2
DEVICE RAD COMP TR BAND LRG (VASCULAR PRODUCTS) ×1 IMPLANT
GLIDESHEATH SLEND SS 6F .021 (SHEATH) ×1 IMPLANT
GUIDEWIRE INQWIRE 1.5J.035X260 (WIRE) IMPLANT
INQWIRE 1.5J .035X260CM (WIRE) ×2
KIT HEART LEFT (KITS) ×2 IMPLANT
KIT MICROPUNCTURE NIT STIFF (SHEATH) ×1 IMPLANT
PACK CARDIAC CATHETERIZATION (CUSTOM PROCEDURE TRAY) ×2 IMPLANT
SHEATH GLIDE SLENDER 4/5FR (SHEATH) ×1 IMPLANT
SHEATH PROBE COVER 6X72 (BAG) ×1 IMPLANT
TRANSDUCER W/STOPCOCK (MISCELLANEOUS) ×2 IMPLANT
TUBING CIL FLEX 10 FLL-RA (TUBING) ×2 IMPLANT

## 2019-06-20 NOTE — Interval H&P Note (Signed)
History and Physical Interval Note:  06/20/2019 8:57 AM  Kari Bishop  has presented today for surgery, with the diagnosis of systolic heart failure.  The various methods of treatment have been discussed with the patient and family. After consideration of risks, benefits and other options for treatment, the patient has consented to  Procedure(s): RIGHT/LEFT HEART CATH AND CORONARY ANGIOGRAPHY (N/A) and possible coronary angioplasty as a surgical intervention.  The patient's history has been reviewed, patient examined, no change in status, stable for surgery.  I have reviewed the patient's chart and labs.  Questions were answered to the patient's satisfaction.     [name]

## 2019-06-20 NOTE — H&P (View-Only) (Signed)
  Advanced Heart Failure Rounding Note   Subjective:    Diuresing well. Weight down 10 pounds. Breathing better. No orthopnea or PND.  Objective:   Weight Range:  Vital Signs:   Temp:  [97.8 F (36.6 C)-98.1 F (36.7 C)] 98.1 F (36.7 C) (07/08 0733) Pulse Rate:  [64-87] 87 (07/08 0800) Resp:  [18-28] 27 (07/08 0800) BP: (120-144)/(85-112) 129/96 (07/08 0800) SpO2:  [93 %-100 %] 98 % (07/08 0844) Weight:  [66.8 kg] 66.8 kg (07/08 0500) Last BM Date: 06/18/19  Weight change: Filed Weights   06/19/19 0400 06/20/19 0500  Weight: 71.4 kg 66.8 kg    Intake/Output:   Intake/Output Summary (Last 24 hours) at 06/20/2019 0853 Last data filed at 06/20/2019 0600 Gross per 24 hour  Intake 120 ml  Output 5500 ml  Net -5380 ml     Physical Exam: General:  Sitting up in bed No resp difficulty HEENT: normal Neck: supple. JVP 10. Carotids 2+ bilat; no bruits. No lymphadenopathy or thryomegaly appreciated. Cor: PMI nondisplaced. Regular rate & rhythm. No rubs, gallops or murmurs. Lungs: clear Abdomen: soft, nontender, nondistended. No hepatosplenomegaly. No bruits or masses. Good bowel sounds. Extremities: no cyanosis, clubbing, rash, 1+ edema Neuro: alert & orientedx3, cranial nerves grossly intact. moves all 4 extremities w/o difficulty. Affect pleasant   Telemetry: Sinus 70-80s Personally reviewed   Labs: Basic Metabolic Panel: Recent Labs  Lab 06/18/19 1924 06/19/19 0015 06/19/19 0600 06/20/19 0314  NA 139 141 143 139  K 3.7 3.4* 3.8 4.2  CL 107 102 106 101  CO2 22 26 26 27  GLUCOSE 111* 126* 102* 92  BUN 21* 19 15 16  CREATININE 1.05* 1.50* 0.92 1.11*  CALCIUM 8.9 9.4 8.9 9.3  MG 1.9  --   --   --     Liver Function Tests: No results for input(s): AST, ALT, ALKPHOS, BILITOT, PROT, ALBUMIN in the last 168 hours. No results for input(s): LIPASE, AMYLASE in the last 168 hours. No results for input(s): AMMONIA in the last 168 hours.  CBC: Recent Labs  Lab  06/18/19 1924  WBC 9.3  HGB 15.3*  HCT 45.8  MCV 91.4  PLT 293    Cardiac Enzymes: No results for input(s): CKTOTAL, CKMB, CKMBINDEX, TROPONINI in the last 168 hours.  BNP: BNP (last 3 results) Recent Labs    06/18/19 1924  BNP 2,160.9*    ProBNP (last 3 results) No results for input(s): PROBNP in the last 8760 hours.    Other results:  Imaging: Dg Chest 2 View  Result Date: 06/19/2019 CLINICAL DATA:  CHF EXAM: CHEST - 2 VIEW COMPARISON:  None FINDINGS: RIGHT arm PICC line with tip projecting over SVC near cavoatrial junction Enlargement of cardiac silhouette with pulmonary vascular congestion. Mediastinal contours normal. Small RIGHT pleural effusion and RIGHT basilar atelectasis. No definite acute infiltrate or pulmonary edema. No pneumothorax. Osseous structures unremarkable. IMPRESSION: Enlargement of cardiac silhouette with pulmonary vascular congestion. Small RIGHT pleural effusion and RIGHT basilar atelectasis. Electronically Signed   By: Mark  Boles M.D.   On: 06/19/2019 08:34   [place] Ekg Site Rite  Result Date: 06/18/2019 If Site Rite image not attached, placement could not be confirmed due to current cardiac rhythm.    Medications:     Scheduled Medications: . [MAR Hold] aspirin EC  81 mg Oral Daily  . [MAR Hold] Chlorhexidine Gluconate Cloth  6 each Topical Daily  . [MAR Hold] Chlorhexidine Gluconate Cloth  6 each Topical Daily  . [  MAR Hold] Chlorhexidine Gluconate Cloth  6 each Topical Daily  . [MAR Hold] digoxin  0.125 mg Oral Daily  . [MAR Hold] enoxaparin (LOVENOX) injection  40 mg Subcutaneous Q24H  . [MAR Hold] furosemide  60 mg Intravenous BID  . [MAR Hold] multivitamin with minerals  1 tablet Oral Daily  . [MAR Hold] potassium chloride  20 mEq Oral BID  . [MAR Hold] sacubitril-valsartan  1 tablet Oral BID  . [MAR Hold] sodium chloride flush  10-40 mL Intracatheter Q12H  . [MAR Hold] sodium chloride flush  3 mL Intravenous Q12H  . [MAR Hold] sodium  chloride flush  3 mL Intravenous Q12H  . [MAR Hold] spironolactone  25 mg Oral Daily    Infusions: . [MAR Hold] sodium chloride      PRN Medications: [MAR Hold] sodium chloride, [MAR Hold] acetaminophen, [MAR Hold] ondansetron (ZOFRAN) IV, [MAR Hold] sodium chloride flush, [MAR Hold] sodium chloride flush   Assessment/Plan:   1. Acute systolic HF - echo at [facility] 7/6 showed EF 15% with mild RV dysfunction - echo 15-25 with mild to mod MR  - etiology unclear. Suspect possible NICM (ETOH, HTN?) but ECG with poor anterior forces - remains volume overloaded. Co-ox marginal 55%. May need inotropes  - continue IV lasix 60mg  IV bid - spiro 25mg  daily - Continue Entresto to 49/51 bid - digoxin 0.125 - hold carvedilol for now with possible low output - Repeat echo - Plan cath today - cMRI later in week once tuned up  2. H/o ETOH abuse - quit in May - no h/o DTs  3. HTN - improving on Entresto  Can go to [facility]   Length of Stay: 2   Glori Bickers MD 06/20/2019, 8:53 AM  Advanced Heart Failure Team Pager [phone] (M-F; [place])  Please contact [facility] for night-coverage after hours (4p -7a ) and weekends on [email or web address]

## 2019-06-20 NOTE — Progress Notes (Signed)
Advanced Heart Failure Rounding Note   Subjective:    Diuresing well. Weight down 10 pounds. Breathing better. No orthopnea or PND.  Objective:   Weight Range:  Vital Signs:   Temp:  [97.8 F (36.6 C)-98.1 F (36.7 C)] 98.1 F (36.7 C) (07/08 0733) Pulse Rate:  [64-87] 87 (07/08 0800) Resp:  [18-28] 27 (07/08 0800) BP: (120-144)/(85-112) 129/96 (07/08 0800) SpO2:  [93 %-100 %] 98 % (07/08 0844) Weight:  [66.8 kg] 66.8 kg (07/08 0500) Last BM Date: 06/18/19  Weight change: Filed Weights   06/19/19 0400 06/20/19 0500  Weight: 71.4 kg 66.8 kg    Intake/Output:   Intake/Output Summary (Last 24 hours) at 06/20/2019 0853 Last data filed at 06/20/2019 0600 Gross per 24 hour  Intake 120 ml  Output 5500 ml  Net -5380 ml     Physical Exam: General:  Sitting up in bed No resp difficulty HEENT: normal Neck: supple. JVP 10. Carotids 2+ bilat; no bruits. No lymphadenopathy or thryomegaly appreciated. Cor: PMI nondisplaced. Regular rate & rhythm. No rubs, gallops or murmurs. Lungs: clear Abdomen: soft, nontender, nondistended. No hepatosplenomegaly. No bruits or masses. Good bowel sounds. Extremities: no cyanosis, clubbing, rash, 1+ edema Neuro: alert & orientedx3, cranial nerves grossly intact. moves all 4 extremities w/o difficulty. Affect pleasant   Telemetry: Sinus 70-80s Personally reviewed   Labs: Basic Metabolic Panel: Recent Labs  Lab 06/18/19 1924 06/19/19 0015 06/19/19 0600 06/20/19 0314  NA 139 141 143 139  K 3.7 3.4* 3.8 4.2  CL 107 102 106 101  CO2 22 26 26 27   GLUCOSE 111* 126* 102* 92  BUN 21* 19 15 16   CREATININE 1.05* 1.50* 0.92 1.11*  CALCIUM 8.9 9.4 8.9 9.3  MG 1.9  --   --   --     Liver Function Tests: No results for input(s): AST, ALT, ALKPHOS, BILITOT, PROT, ALBUMIN in the last 168 hours. No results for input(s): LIPASE, AMYLASE in the last 168 hours. No results for input(s): AMMONIA in the last 168 hours.  CBC: Recent Labs  Lab  06/18/19 1924  WBC 9.3  HGB 15.3*  HCT 45.8  MCV 91.4  PLT 293    Cardiac Enzymes: No results for input(s): CKTOTAL, CKMB, CKMBINDEX, TROPONINI in the last 168 hours.  BNP: BNP (last 3 results) Recent Labs    06/18/19 1924  BNP 2,160.9*    ProBNP (last 3 results) No results for input(s): PROBNP in the last 8760 hours.    Other results:  Imaging: Dg Chest 2 View  Result Date: 06/19/2019 CLINICAL DATA:  CHF EXAM: CHEST - 2 VIEW COMPARISON:  None FINDINGS: RIGHT arm PICC line with tip projecting over SVC near cavoatrial junction Enlargement of cardiac silhouette with pulmonary vascular congestion. Mediastinal contours normal. Small RIGHT pleural effusion and RIGHT basilar atelectasis. No definite acute infiltrate or pulmonary edema. No pneumothorax. Osseous structures unremarkable. IMPRESSION: Enlargement of cardiac silhouette with pulmonary vascular congestion. Small RIGHT pleural effusion and RIGHT basilar atelectasis. Electronically Signed   By: Ulyses Southward M.D.   On: 06/19/2019 08:34   [place] Ekg Site Rite  Result Date: 06/18/2019 If Site Rite image not attached, placement could not be confirmed due to current cardiac rhythm.    Medications:     Scheduled Medications: . [MAR Hold] aspirin EC  81 mg Oral Daily  . [MAR Hold] Chlorhexidine Gluconate Cloth  6 each Topical Daily  . [MAR Hold] Chlorhexidine Gluconate Cloth  6 each Topical Daily  . [  MAR Hold] Chlorhexidine Gluconate Cloth  6 each Topical Daily  . [MAR Hold] digoxin  0.125 mg Oral Daily  . [MAR Hold] enoxaparin (LOVENOX) injection  40 mg Subcutaneous Q24H  . [MAR Hold] furosemide  60 mg Intravenous BID  . [MAR Hold] multivitamin with minerals  1 tablet Oral Daily  . [MAR Hold] potassium chloride  20 mEq Oral BID  . [MAR Hold] sacubitril-valsartan  1 tablet Oral BID  . [MAR Hold] sodium chloride flush  10-40 mL Intracatheter Q12H  . [MAR Hold] sodium chloride flush  3 mL Intravenous Q12H  . [MAR Hold] sodium  chloride flush  3 mL Intravenous Q12H  . [MAR Hold] spironolactone  25 mg Oral Daily    Infusions: . [MAR Hold] sodium chloride      PRN Medications: [MAR Hold] sodium chloride, [MAR Hold] acetaminophen, [MAR Hold] ondansetron (ZOFRAN) IV, [MAR Hold] sodium chloride flush, [MAR Hold] sodium chloride flush   Assessment/Plan:   1. Acute systolic HF - echo at [facility] 7/6 showed EF 15% with mild RV dysfunction - echo 15-25 with mild to mod MR  - etiology unclear. Suspect possible NICM (ETOH, HTN?) but ECG with poor anterior forces - remains volume overloaded. Co-ox marginal 55%. May need inotropes  - continue IV lasix 60mg  IV bid - spiro 25mg  daily - Continue Entresto to 49/51 bid - digoxin 0.125 - hold carvedilol for now with possible low output - Repeat echo - Plan cath today - cMRI later in week once tuned up  2. H/o ETOH abuse - quit in May - no h/o DTs  3. HTN - improving on Entresto  Can go to [facility]   Length of Stay: 2   Glori Bickers MD 06/20/2019, 8:53 AM  Advanced Heart Failure Team Pager [phone] (M-F; [place])  Please contact [facility] for night-coverage after hours (4p -7a ) and weekends on [email or web address]

## 2019-06-21 ENCOUNTER — Inpatient Hospital Stay (HOSPITAL_COMMUNITY): Payer: Commercial Managed Care - PPO

## 2019-06-21 DIAGNOSIS — I5021 Acute systolic (congestive) heart failure: Secondary | ICD-10-CM

## 2019-06-21 LAB — BASIC METABOLIC PANEL
Anion gap: 9 (ref 5–15)
BUN: 12 mg/dL (ref 6–20)
CO2: 24 mmol/L (ref 22–32)
Calcium: 8.9 mg/dL (ref 8.9–10.3)
Chloride: 102 mmol/L (ref 98–111)
Creatinine, Ser: 0.93 mg/dL (ref 0.44–1.00)
GFR calc Af Amer: >60 mL/min (ref >60)
GFR calc non Af Amer: >60 mL/min (ref >60)
Glucose, Bld: 93 mg/dL (ref 70–99)
Potassium: 4.1 mmol/L (ref 3.5–5.1)
Sodium: 135 mmol/L (ref 135–145)

## 2019-06-21 LAB — COOXEMETRY PANEL
Carboxyhemoglobin: 1.2 % (ref 0.5–1.5)
Methemoglobin: 0.9 % (ref 0.0–1.5)
O2 Saturation: 61.7 %
Total hemoglobin: 17.6 g/dL — ABNORMAL HIGH (ref 12.0–16.0)

## 2019-06-21 MED ORDER — FUROSEMIDE 40 MG PO TABS
40.0000 mg | ORAL_TABLET | Freq: Every day | ORAL | Status: DC
  Administered 2019-06-22: 08:00:00 40 mg via ORAL
  Filled 2019-06-21: qty 1

## 2019-06-21 MED ORDER — PNEUMOCOCCAL VAC POLYVALENT 25 MCG/0.5ML IJ INJ
0.5000 mL | INJECTION | INTRAMUSCULAR | Status: AC
  Administered 2019-06-22: 0.5 mL via INTRAMUSCULAR
  Filled 2019-06-21: qty 0.5

## 2019-06-21 MED ORDER — SACUBITRIL-VALSARTAN 97-103 MG PO TABS
1.0000 | ORAL_TABLET | Freq: Two times a day (BID) | ORAL | Status: DC
  Administered 2019-06-21 – 2019-06-22 (×2): 1 via ORAL
  Filled 2019-06-21 (×3): qty 1

## 2019-06-21 MED ORDER — DIAZEPAM 2 MG PO TABS
2.0000 mg | ORAL_TABLET | Freq: Once | ORAL | Status: AC
  Administered 2019-06-21: 2 mg via ORAL
  Filled 2019-06-21: qty 1

## 2019-06-21 MED ORDER — GADOBUTROL 1 MMOL/ML IV SOLN
8.0000 mL | Freq: Once | INTRAVENOUS | Status: AC | PRN
  Administered 2019-06-21: 8 mL via INTRAVENOUS

## 2019-06-21 NOTE — TOC Benefit Eligibility Note (Signed)
Transition of Care [facility]) Benefit Eligibility Note    Patient Details  Name: Kari Bishop MRN: [id number] Date of Birth: 1971-12-06   Medication/Dose: BIDIL 20 MG  TID  Covered?: Yes  Tier: (NO TIER)  Prescription Coverage Preferred Pharmacy: [facility] AND  [facility]  Spoke with Person/Company/Phone Number:: LAURA @  [facility] # [phone]  Co-Pay: $60.00     Deductible: Unmet  Additional Notes: ENTRESTO 97 MG BID , COVER- YES, CO-PAY- $40.00, TIER- NO, P/ Janeann Merl Phone Number: 06/21/2019, 5:32 PM

## 2019-06-21 NOTE — Progress Notes (Signed)
Advanced Heart Failure Rounding Note   Subjective:    Feeling better. Cath yesterday with normal coronaries. EF 15%. SVR very high.   Walking halls. No dyspnea. No orthopnea or PND. Edema resolved. BP remains high.  CVP 4-5  Ao = 133/97 (114) LV = 130/22 RA = 2 RV = 43/2 PA = 43/21 (31) PCW = 20 Fick cardiac output/index =4.4/2.5 PVR = 2.5 WU SVR = 2046 Ao sat = 97% PA sat = 71%, 71%  Objective:   Weight Range:  Vital Signs:   Temp:  [97.8 F (36.6 C)-98.4 F (36.9 C)] 98.1 F (36.7 C) (07/09 1457) Pulse Rate:  [83-87] 87 (07/09 0724) Resp:  [18-28] 26 (07/09 0724) BP: (131-152)/(81-96) 152/82 (07/09 1059) SpO2:  [94 %-96 %] 96 % (07/09 0724) Weight:  [68 kg] 68 kg (07/09 0318) Last BM Date: 06/18/19(per patient; RN did not witness)  Weight change: Filed Weights   06/20/19 0500 06/20/19 1131 06/21/19 0318  Weight: 66.8 kg 66.7 kg 68 kg    Intake/Output:   Intake/Output Summary (Last 24 hours) at 06/21/2019 1621 Last data filed at 06/21/2019 1458 Gross per 24 hour  Intake 660 ml  Output 1950 ml  Net -1290 ml     Physical Exam: General:  Well appearing. No resp difficulty HEENT: normal Neck: supple. no JVD. Carotids 2+ bilat; no bruits. No lymphadenopathy or thryomegaly appreciated. Cor: PMI nondisplaced. Regular rate & rhythm. No rubs, gallops or murmurs. Lungs: clear Abdomen: soft, nontender, nondistended. No hepatosplenomegaly. No bruits or masses. Good bowel sounds. Extremities: no cyanosis, clubbing, rash, edema Neuro: alert & orientedx3, cranial nerves grossly intact. moves all 4 extremities w/o difficulty. Affect pleasant   Telemetry: Sinus 80s Personally reviewed   Labs: Basic Metabolic Panel: Recent Labs  Lab 06/18/19 1924 06/19/19 0015 06/19/19 0600 06/20/19 0314 06/20/19 0911 06/20/19 0919 06/20/19 1047 06/21/19 0530  NA 139 141 143 139 143 142  144  --  135  K 3.7 3.4* 3.8 4.2 3.8 4.1  3.8  --  4.1  CL 107 102 106 101  --    --   --  102  CO2 22 26 26 27   --   --   --  24  GLUCOSE 111* 126* 102* 92  --   --   --  93  BUN 21* 19 15 16   --   --   --  12  CREATININE 1.05* 1.50* 0.92 1.11*  --   --  0.87 0.93  CALCIUM 8.9 9.4 8.9 9.3  --   --   --  8.9  MG 1.9  --   --   --   --   --   --   --     Liver Function Tests: No results for input(s): AST, ALT, ALKPHOS, BILITOT, PROT, ALBUMIN in the last 168 hours. No results for input(s): LIPASE, AMYLASE in the last 168 hours. No results for input(s): AMMONIA in the last 168 hours.  CBC: Recent Labs  Lab 06/18/19 1924 06/20/19 0911 06/20/19 0919 06/20/19 1047  WBC 9.3  --   --  8.7  HGB 15.3* 18.4* 19.0*  18.7* 16.5*  HCT 45.8 54.0* 56.0*  55.0* 49.0*  MCV 91.4  --   --  91.8  PLT 293  --   --  311    Cardiac Enzymes: No results for input(s): CKTOTAL, CKMB, CKMBINDEX, TROPONINI in the last 168 hours.  BNP: BNP (last 3 results) Recent Labs  06/18/19 1924  BNP 2,160.9*    ProBNP (last 3 results) No results for input(s): PROBNP in the last 8760 hours.    Other results:  Imaging: No results found.   Medications:     Scheduled Medications: . Chlorhexidine Gluconate Cloth  6 each Topical Daily  . digoxin  0.125 mg Oral Daily  . enoxaparin (LOVENOX) injection  40 mg Subcutaneous Q24H  . [START ON 06/22/2019] furosemide  40 mg Oral Daily  . multivitamin with minerals  1 tablet Oral Daily  . [START ON 06/22/2019] pneumococcal 23 valent vaccine  0.5 mL Intramuscular Tomorrow-1000  . sacubitril-valsartan  1 tablet Oral BID  . sodium chloride flush  10-40 mL Intracatheter Q12H  . spironolactone  25 mg Oral Daily    Infusions: . sodium chloride      PRN Medications: sodium chloride, acetaminophen, ondansetron (ZOFRAN) IV, sodium chloride flush   Assessment/Plan:   1. Acute systolic HF - suspect HTN CM - echo at [facility] 7/6 showed EF 15% with mild RV dysfunction - echo 15-25 with mild to mod MR  - cath 7/8 with normal cors. PCWP  20. - co-ox today 62%  - CVP 4-5. BP high  - spiro 25mg  daily - Increase Entresto to 97/103 bid - digoxin 0.125 - possibly start carvedilol in am. - cMRI today - Case manager consult to help with meds - Ambulate  2. H/o ETOH abuse - quit in May - no h/o DTs  3. HTN - still elevated. Increase Entresto  Can go to [facility]   Length of Stay: 3   Glori Bickers MD 06/21/2019, 4:21 PM  Advanced Heart Failure Team Pager [phone] (M-F; [place])  Please contact [facility] for night-coverage after hours (4p -7a ) and weekends on [email or web address]

## 2019-06-21 NOTE — Plan of Care (Signed)
  Problem: Education: Goal: Ability to demonstrate management of disease process will improve Outcome: Progressing Goal: Ability to verbalize understanding of medication therapies will improve Outcome: Progressing Goal: Individualized Educational Video(s) Outcome: Progressing   Problem: Activity: Goal: Capacity to carry out activities will improve Outcome: Progressing   Problem: Cardiac: Goal: Ability to achieve and maintain adequate cardiopulmonary perfusion will improve Outcome: Progressing   Problem: Education: Goal: Understanding of CV disease, CV risk reduction, and recovery process will improve Outcome: Progressing Goal: Individualized Educational Video(s) Outcome: Progressing   Problem: Activity: Goal: Ability to return to baseline activity level will improve Outcome: Progressing   Problem: Cardiovascular: Goal: Ability to achieve and maintain adequate cardiovascular perfusion will improve Outcome: Progressing Goal: Vascular access site(s) Level 0-1 will be maintained Outcome: Progressing   Problem: Health Behavior/Discharge Planning: Goal: Ability to safely manage health-related needs after discharge will improve Outcome: Progressing   Problem: Education: Goal: Knowledge of General Education information will improve Description: Including pain rating scale, medication(s)/side effects and non-pharmacologic comfort measures Outcome: Progressing   Problem: Health Behavior/Discharge Planning: Goal: Ability to manage health-related needs will improve Outcome: Progressing   Problem: Clinical Measurements: Goal: Ability to maintain clinical measurements within normal limits will improve Outcome: Progressing Goal: Will remain free from infection Outcome: Progressing Goal: Diagnostic test results will improve Outcome: Progressing Goal: Respiratory complications will improve Outcome: Progressing Goal: Cardiovascular complication will be avoided Outcome:  Progressing   Problem: Activity: Goal: Risk for activity intolerance will decrease Outcome: Progressing   Problem: Nutrition: Goal: Adequate nutrition will be maintained Outcome: Progressing   Problem: Coping: Goal: Level of anxiety will decrease Outcome: Progressing   Problem: Elimination: Goal: Will not experience complications related to bowel motility Outcome: Progressing Goal: Will not experience complications related to urinary retention Outcome: Progressing   Problem: Pain Managment: Goal: General experience of comfort will improve Outcome: Progressing   Problem: Safety: Goal: Ability to remain free from injury will improve Outcome: Progressing   Problem: Skin Integrity: Goal: Risk for impaired skin integrity will decrease Outcome: Progressing   

## 2019-06-22 LAB — BASIC METABOLIC PANEL
Anion gap: 9 (ref 5–15)
BUN: 13 mg/dL (ref 6–20)
CO2: 23 mmol/L (ref 22–32)
Calcium: 9.1 mg/dL (ref 8.9–10.3)
Chloride: 102 mmol/L (ref 98–111)
Creatinine, Ser: 0.85 mg/dL (ref 0.44–1.00)
GFR calc Af Amer: >60 mL/min (ref >60)
GFR calc non Af Amer: >60 mL/min (ref >60)
Glucose, Bld: 101 mg/dL — ABNORMAL HIGH (ref 70–99)
Potassium: 3.9 mmol/L (ref 3.5–5.1)
Sodium: 134 mmol/L — ABNORMAL LOW (ref 135–145)

## 2019-06-22 LAB — COOXEMETRY PANEL
Carboxyhemoglobin: 0.8 % (ref 0.5–1.5)
Methemoglobin: 0.6 % (ref 0.0–1.5)
O2 Saturation: 60.2 %
Total hemoglobin: 17.7 g/dL — ABNORMAL HIGH (ref 12.0–16.0)

## 2019-06-22 MED ORDER — SPIRONOLACTONE 25 MG PO TABS: 25.0000 mg | ORAL_TABLET | Freq: Every day | ORAL | 6 refills | Status: DC

## 2019-06-22 MED ORDER — CARVEDILOL 3.125 MG PO TABS: 3.1250 mg | ORAL_TABLET | Freq: Two times a day (BID) | ORAL | 0 refills | Status: DC

## 2019-06-22 MED ORDER — SPIRONOLACTONE 25 MG PO TABS: 25.0000 mg | ORAL_TABLET | Freq: Every day | ORAL | 0 refills | Status: DC

## 2019-06-22 MED ORDER — SACUBITRIL-VALSARTAN 97-103 MG PO TABS: 1.0000 | ORAL_TABLET | Freq: Two times a day (BID) | ORAL | 0 refills | Status: DC

## 2019-06-22 MED ORDER — SACUBITRIL-VALSARTAN 97-103 MG PO TABS: 1.0000 | ORAL_TABLET | Freq: Two times a day (BID) | ORAL | 6 refills | Status: DC

## 2019-06-22 MED ORDER — DIGOXIN 125 MCG PO TABS: 0.1250 mg | ORAL_TABLET | Freq: Every day | ORAL | 0 refills | Status: DC

## 2019-06-22 MED ORDER — POTASSIUM CHLORIDE CRYS ER 20 MEQ PO TBCR
20.0000 meq | EXTENDED_RELEASE_TABLET | Freq: Once | ORAL | Status: AC
  Administered 2019-06-22: 20 meq via ORAL
  Filled 2019-06-22: qty 1

## 2019-06-22 MED ORDER — CARVEDILOL 3.125 MG PO TABS: 3.1250 mg | ORAL_TABLET | Freq: Two times a day (BID) | ORAL | 11 refills | Status: DC

## 2019-06-22 MED ORDER — DIGOXIN 125 MCG PO TABS: 0.1250 mg | ORAL_TABLET | Freq: Every day | ORAL | 6 refills | Status: DC

## 2019-06-22 MED ORDER — FUROSEMIDE 40 MG PO TABS: 40.0000 mg | ORAL_TABLET | Freq: Every day | ORAL | 0 refills | Status: DC

## 2019-06-22 MED ORDER — FUROSEMIDE 40 MG PO TABS: 40.0000 mg | ORAL_TABLET | Freq: Every day | ORAL | 6 refills | Status: DC

## 2019-06-22 MED FILL — CARVEDILOL 3.125 MG TABLET: 3.125 | 30 days supply | Qty: 60 | Fill #0

## 2019-06-22 MED FILL — FUROSEMIDE 40 MG TABLET: 40 | 30 days supply | Qty: 30 | Fill #0

## 2019-06-22 MED FILL — SPIRONOLACTONE 25 MG TABLET: 25 | 30 days supply | Qty: 30 | Fill #0

## 2019-06-22 MED FILL — DIGOXIN 0.125 MG TABLET: 125 | 30 days supply | Qty: 30 | Fill #0

## 2019-06-22 MED FILL — ENTRESTO 97 MG-103 MG TAB: 97-103 | 30 days supply | Qty: 60 | Fill #0

## 2019-06-22 NOTE — Discharge Summary (Signed)
Advanced Heart Failure Team  Discharge Summary   Patient ID: Kari Bishop MRN: [id number], DOB/AGE: May 06, 1971 48 y.o. Admit date: 06/18/2019 D/C date:     06/22/2019   Primary Discharge Diagnoses:  1. 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 HTN 3. ETOH and tobacco se, quit 5/20  Hospital Course:   48 y/o woman with h/o ETOH/tobacco use and undiagnosed severe HTN transferred from [facility] by Dr. Tomie China with new onset systolic HF EF 15-20%.   Diuresed well from 157 pounds to 147 pounds. Underwent R/L cath on 06/20/19. Normal coronaries. RHC as below.   Initially felt to be possible ETOH CM but LV not that dilated and RV ok. While in hospital BP very high and meds titrated. Felt to be HTN cardiomyopathy.   Cardiac MRI done on the morning of d/c. Results pending    Discharge Weight Range: 147 pounds Discharge Vitals: Blood pressure 127/76, pulse 77, temperature 98.1 F (36.7 C), temperature source Oral, resp. rate (!) 25, height 5\' 5"  (1.651 m), weight 66.7 kg, SpO2 96 %.   General:  Well appearing. No resp difficulty HEENT: normal Neck: supple. no JVD. Carotids 2+ bilat; no bruits. No lymphadenopathy or thryomegaly appreciated. Cor: PMI nondisplaced. Regular rate & rhythm. No rubs, gallops or murmurs. Lungs: clear Abdomen: soft, nontender, nondistended. No hepatosplenomegaly. No bruits or masses. Good bowel sounds. Extremities: no cyanosis, clubbing, rash, edema Neuro: alert & orientedx3, cranial nerves grossly intact. moves all 4 extremities w/o difficulty. Affect pleasant  D/c meds: Entresto 97/103 bid Digoxin 0.125 daily Spiro 25 daily Carvedilol 3.125 bid Lasix 40 daily  F/u in HF Clinic in 1-2 weeks. Eventually will arrange f/u with [facility]  Labs: Lab Results  Component Value Date   WBC 8.7 06/20/2019   HGB 16.5 (H) 06/20/2019   HCT 49.0 (H) 06/20/2019   MCV 91.8 06/20/2019   PLT 311 06/20/2019    Recent Labs  Lab  06/22/19 0400  NA 134*  K 3.9  CL 102  CO2 23  BUN 13  CREATININE 0.85  CALCIUM 9.1  GLUCOSE 101*   No results found for: CHOL, HDL, LDLCALC, TRIG BNP (last 3 results) Recent Labs    06/18/19 1924  BNP 2,160.9*    ProBNP (last 3 results) No results for input(s): PROBNP in the last 8760 hours.   Diagnostic Studies/Procedures   See cath results above  Discharge Medications   Allergies as of 06/22/2019   No Known Allergies     Medication List    TAKE these medications   carvedilol 3.125 MG tablet Commonly known as: Coreg Take 1 tablet (3.125 mg total) by mouth 2 (two) times daily.   digoxin 0.125 MG tablet Commonly known as: LANOXIN Take 1 tablet (0.125 mg total) by mouth daily.   furosemide 40 MG tablet Commonly known as: LASIX Take 1 tablet (40 mg total) by mouth daily.   sacubitril-valsartan 97-103 MG Commonly known as: ENTRESTO Take 1 tablet by mouth 2 (two) times daily.   spironolactone 25 MG tablet Commonly known as: ALDACTONE Take 1 tablet (25 mg total) by mouth daily.       Disposition   The patient will be discharged in stable condition to home. Discharge Instructions    (HEART FAILURE PATIENTS) Call MD:  Anytime you have any of the following symptoms: 1) 3 pound weight gain in 24 hours or 5 pounds in 1 week 2) shortness of breath, with or  without a dry hacking cough 3) swelling in the hands, feet or stomach 4) if you have to sleep on extra pillows at night in order to breathe.   Complete by: As directed    Diet - low sodium heart healthy   Complete by: As directed    Heart Failure patients record your daily weight using the same scale at the same time of day   Complete by: As directed    Increase activity slowly   Complete by: As directed      Follow-up Information    [facility] Follow up on 07/03/2019.   Specialty: Cardiology Why: at [place] information: [address] [id number] [place] [phone]            Duration of Discharge Encounter: Greater than 35 minutes   Signed, Amy Clegg NP-C  06/22/2019, 8:28 AM

## 2019-06-22 NOTE — Discharge Instructions (Signed)
Heart Failure Action Plan A heart failure action plan helps you understand what to do when you have symptoms of heart failure. Follow the plan that was created by you and your health care provider. Review your plan each time you visit your health care provider. Red zone These signs and symptoms mean you should get medical help right away:  You have trouble breathing when resting.  You have a dry cough that is getting worse.  You have swelling or pain in your legs or abdomen that is getting worse.  You suddenly gain more than 2-3 lb (0.9-1.4 kg) in a day, or more than 5 lb (2.3 kg) in one week. This amount may be more or less depending on your condition.  You have trouble staying awake or you feel confused.  You have chest pain.  You do not have an appetite.  You pass out. If you experience any of these symptoms:  Call your local emergency services (911 in the [place] right away or seek help at the emergency department of the nearest hospital. Yellow zone These signs and symptoms mean your condition may be getting worse and you should make some changes:  You have trouble breathing when you are active or you need to sleep with extra pillows.  You have swelling in your legs or abdomen.  You gain 2-3 lb (0.9-1.4 kg) in one day, or 5 lb (2.3 kg) in one week. This amount may be more or less depending on your condition.  You get tired easily.  You have trouble sleeping.  You have a dry cough. If you experience any of these symptoms:  Contact your health care provider within the next day.  Your health care provider may adjust your medicines. Green zone These signs mean you are doing well and can continue what you are doing:  You do not have shortness of breath.  You have very little swelling or no new swelling.  Your weight is stable (no gain or loss).  You have a normal activity level.  You do not have chest pain or any other new symptoms. Follow these instructions at  home:  Take over-the-counter and prescription medicines only as told by your health care provider.  Weigh yourself daily. Your target weight is __________ lb (__________ kg). ? Call your health care provider if you gain more than __________ lb (__________ kg) in a day, or more than __________ lb (__________ kg) in one week.  Eat a heart-healthy diet. Work with a diet and nutrition specialist (dietitian) to create an eating plan that is best for you.  Keep all follow-up visits as told by your health care provider. This is important. Where to find more information  American Heart Association: www.heart.org Summary  Follow the action plan that was created by you and your health care provider.  Get help right away if you have any symptoms in the Red zone. This information is not intended to replace advice given to you by your health care provider. Make sure you discuss any questions you have with your health care provider. Document Released: 01/08/2017 Document Revised: 11/11/2017 Document Reviewed: 01/08/2017 Elsevier Patient Education  2020 [place].   Heart Failure, Self Care Heart failure is a serious condition. This document explains the things you need to do to take care of yourself after a heart failure diagnosis. You may be asked to change your diet, take certain medicines, and make other lifestyle changes in order to stay as healthy as possible. Your health  care provider may also give you more specific instructions. If you have problems or questions, contact your health care provider. What are the risks? Having heart failure puts you at higher risk for certain problems. These problems can get worse if you do not take good care of yourself. Problems may include:  Blood clotting problems. This may cause a stroke.  Damage to the kidneys, liver, or lungs.  Abnormal heart rhythms. Supplies needed:  Scale for monitoring weight.  Blood pressure  monitor.  Notebook.  Medicines. How to care for yourself when you have heart failure Medicines Take over-the-counter and prescription medicines only as told by your health care provider. Medicines reduce the workload of your heart, slow the progression of heart failure, and improve symptoms. Take your medicines every day.  Do not stop taking your medicine unless your health care provider tells you to do so.  Do not skip any dose of medicine.  Refill your prescriptions before you run out of medicine. Eating and drinking   Eat heart-healthy foods. Talk with a dietitian to make an eating plan that is right for you. ? Choose foods that contain no trans fat and are low in saturated fat and cholesterol. Healthy choices include fresh or frozen fruits and vegetables, fish, lean meats, legumes, fat-free or low-fat dairy products, and whole-grain or high-fiber foods. ? Limit salt (sodium) if told by your health care provider. Sodium restriction may reduce symptoms of heart failure. Ask a dietitian to recommend heart-healthy seasonings. ? Use healthy cooking methods instead of frying. Healthy methods include roasting, grilling, broiling, baking, poaching, steaming, and stir-frying.  Limit your fluid intake, if directed by your health care provider. Fluid restriction may reduce symptoms of heart failure. Alcohol use  Do not drink alcohol if: ? Your health care provider tells you not to drink. ? Your heart was damaged by alcohol, or you have severe heart failure. ? You are pregnant, may be pregnant, or are planning to become pregnant.  If you drink alcohol: ? Limit how much you use to:  0-1 drink a day for women.  0-2 drinks a day for men. ? Be aware of how much alcohol is in your drink. In the [place] one drink equals one 12 oz bottle of beer (355 mL), one 5 oz glass of wine (148 mL), or one 1 oz glass of hard liquor (44 mL). Lifestyle   Do not use any products that contain nicotine or  tobacco, such as cigarettes, e-cigarettes, and chewing tobacco. If you need help quitting, ask your health care provider. ? Do not use nicotine gum or patches before talking to your health care provider.  Do not use illegal drugs.  Work with your health care provider to safely reach the right body weight.  Do physical activity if told by your health care provider. Talk to your health care provider before you begin an exercise if: ? You are an older adult. ? You have severe heart failure.  Learn to manage stress. If you need help to do this, ask your health care provider.  Participate in or seek rehabilitation as needed to keep or improve your independence and quality of life.  Plan rest periods when you get tired. Monitoring important information   Weigh yourself every day. This will help you to notice if too much fluid is building up in your body. ? Weigh yourself every morning after you urinate and before you eat breakfast. ? Wear the same amount of clothing each time you  weigh yourself. ? Record your daily weight. Provide your health care provider with your weight record.  Monitor and record your pulse and blood pressure as told by your health care provider. Dealing with extreme temperatures  If the weather is extremely hot: ? Avoid vigorous physical activity. ? Use air conditioning or fans, or find a cooler location. ? Avoid caffeine and alcohol. ? Wear loose-fitting, lightweight, and light-colored clothing.  If the weather is extremely cold: ? Avoid vigorous activity. ? Layer your clothes. ? Wear mittens or gloves, a hat, and a scarf when you go outside. ? Avoid alcohol. Follow these instructions at home:  Stay up to date with vaccines. Pneumococcal and flu (influenza) vaccines are especially important in preventing infections of the airways.  Keep all follow-up visits as told by your health care provider. This is important. Contact a health care provider if  you:  Have a rapid weight gain.  Have increasing shortness of breath.  Are unable to participate in your usual physical activities.  Get tired easily.  Cough more than normal, especially with physical activity.  Lose your appetite or feel nauseous.  Have any swelling or more swelling in areas such as your hands, feet, ankles, or abdomen.  Are unable to sleep because it is hard to breathe.  Feel like your heart is beating quickly (palpitations).  Become dizzy or light-headed when you stand up. Get help right away if you:  Have trouble breathing.  Notice or your family notices a change in your awareness, such as having trouble staying awake or concentrating.  Have pain or discomfort in your chest.  Have an episode of fainting (syncope). These symptoms may represent a serious problem that is an emergency. Do not wait to see if the symptoms will go away. Get medical help right away. Call your local emergency services (911 in the [place] Do not drive yourself to the hospital. Summary  Heart failure is a serious condition. To care for yourself, you may be asked to change your diet, take certain medicines, and make other lifestyle changes.  Take your medicines every day. Do not stop taking them unless your health care provider tells you to do so.  Eat heart-healthy foods, such as fresh or frozen fruits and vegetables, fish, lean meats, legumes, fat-free or low-fat dairy products, and whole-grain or high-fiber foods.  Ask your health care provider if you have any alcohol restrictions. You may have to stop drinking alcohol if you have severe heart failure.  Contact your health care provider if you notice problems, such as rapid weight gain or a fast heartbeat. Get help right away if you faint, or have chest pain or trouble breathing. This information is not intended to replace advice given to you by your health care provider. Make sure you discuss any questions you have with your  health care provider. Document Released: 03/14/2019 Document Revised: 03/13/2019 Document Reviewed: 03/14/2019 Elsevier Patient Education  2020 [employer]   Heart Failure Eating Plan Heart failure, also called congestive heart failure, occurs when your heart does not pump blood well enough to meet your body's needs for oxygen-rich blood. Heart failure is a long-term (chronic) condition. Living with heart failure can be challenging. However, following your health care provider's instructions about a healthy lifestyle and working with a diet and nutrition specialist (dietitian) to choose the right foods may help to improve your symptoms. What are tips for following this plan? Reading food labels  Check food labels for the amount of  sodium per serving. Choose foods that have less than 140 mg (milligrams) of sodium in each serving.  Check food labels for the number of calories per serving. This is important if you need to limit your daily calorie intake to lose weight.  Check food labels for the serving size. If you eat more than one serving, you will be eating more sodium and calories than what is listed on the label.  Look for foods that are labeled as "sodium-free," "very low sodium," or "low sodium." ? Foods labeled as "reduced sodium" or "lightly salted" may still have more sodium than what is recommended for you. Cooking  Avoid adding salt when cooking. Ask your health care provider or dietitian before using salt substitutes.  Season food with salt-free seasonings, spices, or herbs. Check the label of seasoning mixes to make sure they do not contain salt.  Cook with heart-healthy oils, such as olive, canola, soybean, or sunflower oil.  Do not fry foods. Cook foods using low-fat methods, such as baking, boiling, grilling, and broiling.  Limit unhealthy fats when cooking by: ? Removing the skin from poultry, such as chicken. ? Removing all visible fats from meats. ? Skimming the  fat off from stews, soups, and gravies before serving them. Meal planning   Limit your intake of: ? Processed, canned, or pre-packaged foods. ? Foods that are high in trans fat, such as fried foods. ? Sweets, desserts, sugary drinks, and other foods with added sugar. ? Full-fat dairy products, such as whole milk.  Eat a balanced diet that includes: ? 4-5 servings of fruit each day and 4-5 servings of vegetables each day. At each meal, try to fill half of your plate with fruits and vegetables. ? Up to 6-8 servings of whole grains each day. ? Up to 2 servings of lean meat, poultry, or fish each day. One serving of meat is equal to 3 oz. This is about the same size as a deck of cards. ? 2 servings of low-fat dairy each day. ? Heart-healthy fats. Healthy fats called omega-3 fatty acids are found in foods such as flaxseed and cold-water fish like sardines, salmon, and mackerel.  Aim to eat 25-35 g (grams) of fiber a day. Foods that are high in fiber include apples, broccoli, carrots, beans, peas, and whole grains.  Do not add salt or condiments that contain salt (such as soy sauce) to foods before eating.  When eating at a restaurant, ask that your food be prepared with less salt or no salt, if possible.  Try to eat 2 or more vegetarian meals each week.  Eat more home-cooked food and eat less restaurant, buffet, and fast food. General information  Do not eat more than 2,300 mg of salt (sodium) a day. The amount of sodium that is recommended for you may be lower, depending on your condition.  Maintain a healthy body weight as directed. Ask your health care provider what a healthy weight is for you. ? Check your weight every day. ? Work with your health care provider and dietitian to make a plan that is right for you to lose weight or maintain your current weight.  Limit how much fluid you drink. Ask your health care provider or dietitian how much fluid you can have each day.  Limit or  avoid alcohol as told by your health care provider or dietitian. Recommended foods The items listed may not be a complete list. Talk with your dietitian about what dietary choices are best  for you. Fruits All fresh, frozen, and canned fruits. Dried fruits, such as raisins, prunes, and cranberries. Vegetables All fresh vegetables. Vegetables that are frozen without sauce or added salt. Low-sodium or sodium-free canned vegetables. Grains Bread with less than 80 mg of sodium per slice. Whole-wheat pasta, quinoa, and brown rice. Oats and oatmeal. Barley. Millet. Grits and cream of wheat. Whole-grain and whole-wheat cold cereal. Meats and other protein foods Lean cuts of meat. Skinless chicken and [place]. Fish with high omega-3 fatty acids, such as salmon, sardines, and other cold-water fishes. Eggs. Dried beans, peas, and edamame. Unsalted nuts and nut butters. Dairy Low-fat or nonfat (skim) milk and dried milk. Rice milk, soy milk, and almond milk. Low-fat or nonfat yogurt. Small amounts of reduced-sodium block cheese. Low-sodium cottage cheese. Fats and oils Olive, canola, soybean, flaxseed, or sunflower oil. Avocado. Sweets and desserts Apple sauce. Granola bars. Sugar-free pudding and gelatin. Frozen fruit bars. Seasoning and other foods Fresh and dried herbs. Lemon or lime juice. Vinegar. Low-sodium ketchup. Salt-free marinades, salad dressings, sauces, and seasonings. The items listed above may not be a complete list of foods and beverages you can eat. Contact a dietitian for more information. Foods to avoid The items listed may not be a complete list. Talk with your dietitian about what dietary choices are best for you. Fruits Fruits that are dried with sodium-containing preservatives. Vegetables Canned vegetables. Frozen vegetables with sauce or seasonings. Creamed vegetables. [place] fries. Onion rings. Pickled vegetables and sauerkraut. Grains Bread with more than 80 mg of sodium per  slice. Hot or cold cereal with more than 140 mg sodium per serving. Salted pretzels and crackers. Pre-packaged breadcrumbs. Bagels, croissants, and biscuits. Meats and other protein foods Ribs and chicken wings. Bacon, ham, pepperoni, bologna, salami, and packaged luncheon meats. Hot dogs, bratwurst, and sausage. Canned meat. Smoked meat and fish. Salted nuts and seeds. Dairy Whole milk, half-and-half, and cream. Buttermilk. Processed cheese, cheese spreads, and cheese curds. Regular cottage cheese. Feta cheese. Shredded cheese. String cheese. Fats and oils Butter, lard, shortening, ghee, and bacon fat. Canned and packaged gravies. Seasoning and other foods Onion salt, garlic salt, table salt, and sea salt. Marinades. Regular salad dressings. Relishes, pickles, and olives. Meat flavorings and tenderizers, and bouillon cubes. Horseradish, ketchup, and mustard. Worcestershire sauce. Teriyaki sauce, soy sauce (including reduced sodium). Hot sauce and Tabasco sauce. Steak sauce, fish sauce, oyster sauce, and cocktail sauce. Taco seasonings. Barbecue sauce. Tartar sauce. The items listed above may not be a complete list of foods and beverages you should avoid. Contact a dietitian for more information. Summary  A heart failure eating plan includes changes that limit your intake of sodium and unhealthy fat, and it may help you lose weight or maintain a healthy weight. Your health care provider may also recommend limiting how much fluid you drink.  Most people with heart failure should eat no more than 2,300 mg of salt (sodium) a day. The amount of sodium that is recommended for you may be lower, depending on your condition.  Contact your health care provider or dietitian before making any major changes to your diet. This information is not intended to replace advice given to you by your health care provider. Make sure you discuss any questions you have with your health care provider. Document Released:  04/15/2017 Document Revised: 01/25/2019 Document Reviewed: 04/15/2017 Elsevier Patient Education  2020 [employer]   Living With Heart Failure  Heart failure is a long-term (chronic) condition in which the heart  cannot pump enough blood through the body. When this happens, parts of the body do not get the blood and oxygen they need. There is no cure for heart failure at this time, so it is important for you to take good care of yourself and follow the treatment plan set by your health care provider. If you are living with heart failure, there are ways to help you manage the disease. Follow these instructions at home: Living with heart failure requires you to make changes in your life. Your health care team will teach you about the changes you need to make in order to relieve your symptoms and lower your risk of going to the hospital. Follow the treatment plan as set by your health care provider. Medicines Medicines are important in reducing your heart's workload, slowing the progression of heart failure, and improving your symptoms.  Take over-the-counter and prescription medicines only as told by your health care provider.  Do not stop taking your medicine unless your health care provider tells you to do that.  Do not skip any dose of your medicine.  Refill prescriptions before you run out of medicine. You need your medicines every day. Eating and drinking   Eat heart-healthy foods. Talk with a dietitian to make an eating plan that is right for you. ? If directed by your health care provider: ? Limit salt (sodium). Lowering your sodium intake may reduce symptoms of heart failure. Ask a dietitian to recommend heart-healthy seasonings. ? Limit your fluid intake. Fluid restriction may reduce symptoms of heart failure. ? Use low-fat cooking methods instead of frying. Low-fat methods include roasting, grilling, broiling, baking, poaching, steaming, and stir-frying. ? Choose foods that  contain no trans fat and are low in saturated fat and cholesterol. Healthy choices include fresh or frozen fruits and vegetables, fish, lean meats, legumes, fat-free or low-fat dairy products, and whole-grain or high-fiber foods.  Limit alcohol intake to no more than 1 drink a day for nonpregnant women and 2 drinks a day for men. One drink equals 12 oz of beer, 5 oz of wine, or 1 oz of hard liquor. ? Drinking more than that is harmful to your heart. Tell your health care provider if you drink alcohol several times a week. ? Talk with your health care provider about whether any level of alcohol use is safe for you. Activity   Ask your health care provider about attending cardiac rehabilitation. These programs include aerobic physical activity, which provides many benefits for your heart.  If no cardiac rehabilitation program is available, ask your health care provider what aerobic exercises are safe for you to do. Lifestyle Make the lifestyle changes recommended by your health care provider. In general:  Lose weight if your health care provider tells you to do that. Weight loss may reduce symptoms of heart failure.  Do not use any products that contain nicotine or tobacco, such as cigarettes or e-cigarettes. If you need help quitting, ask your health care provider.  Do not use street (illegal) drugs.  Return to your normal activities as told by your health care provider. Ask your health care provider what activities are safe for you. General instructions   Make sure you weigh yourself every day to track your weight. Rapid weight gain may indicate an increase in fluid in your body and may increase the workload of your heart. ? Weigh yourself every morning. Do this after you urinate but before you eat breakfast. ? Wear the same type of  clothing, without shoes, each time you weigh yourself. ? Weigh yourself on the same scale and in the same spot each time.  Living with chronic heart failure  often leads to emotions such as fear, stress, anxiety, and depression. If you feel any of these emotions and need help coping, contact your health care provider. Other ways to get help include: ? Talking to friends and family members about your condition. They can give you support and guidance. Explain your symptoms to them and, if comfortable, invite them to attend appointments or rehabilitation with you. ? Joining a support group for people with chronic heart failure. Talking with other people who have the same symptoms may give you new ways of coping with your disease and your emotions.  Stay up to date with your shots (vaccines). Staying current on pneumococcal and influenza vaccines is especially important in preventing germs from attacking your airways (respiratory infections).  Keep all follow-up visits as told by your health care provider. This is important. How to recognize changes in your condition You and your family members need to know what changes to watch for in your condition. Watch for the following changes and report them to your health care provider:  Sudden weight gain. Ask your health care provider what amount of weight gain to report.  Shortness of breath: ? Feeling short of breath while at rest, with no exercise or activity that required great effort. ? Feeling breathless with activity.  Swelling of your lower legs or ankles.  Difficulty sleeping: ? You wake up feeling short of breath. ? You have to use more pillows to raise your head in order to sleep.  Frequent, dry, hacking cough.  Loss of appetite.  Feeling more tired all the time.  Depression or feelings of sadness or hopelessness.  Bloating in the stomach. Where to find more information  Local support groups. Ask your health care provider about groups near you.  The American Heart Association: www.heart.org Contact a health care provider if:  You have a rapid weight gain.  You have increasing  shortness of breath that is unusual for you.  You are unable to participate in your usual physical activities.  You tire easily.  You cough more than normal, especially with physical activity.  You have any swelling or more swelling in areas such as your hands, feet, ankles, or abdomen.  You feel like your heart is beating quickly (palpitations).  You become dizzy or light-headed when you stand up. Get help right away if:  You have difficulty breathing.  You notice or your family notices a change in your awareness, such as having trouble staying awake or having difficulty with concentration.  You have pain or discomfort in your chest.  You have an episode of fainting (syncope). Summary  There is no cure for heart failure, so it is important for you to take good care of yourself and follow the treatment plan set by your health care provider.  Medicines are important in reducing your heart's workload, slowing the progression of heart failure, and improving your symptoms.  Living with chronic heart failure often leads to emotions such as fear, stress, anxiety, and depression. If you are feeling any of these emotions and need help coping, contact your health care provider. This information is not intended to replace advice given to you by your health care provider. Make sure you discuss any questions you have with your health care provider. Document Released: 04/13/2017 Document Revised: 11/11/2017 Document Reviewed: 04/13/2017 Elsevier  Patient Education  [employer].

## 2019-06-22 NOTE — Care Management (Signed)
Pt to discharge home today, PTA independent.  Pt's discharge medications will be delivered to pt from [place] will provide both free 30 day card and reduced copay card.  Pt informed of copay without copay card.

## 2019-06-22 NOTE — Progress Notes (Signed)
Son here to pick up mother. Pt going home with son and belongings. Discharge instructions given to patient, entresto copay card given and [facility].

## 2019-07-02 NOTE — Progress Notes (Signed)
PCP: None  Primary HF Cardiologist: Dr Gala Romney  HPI: 48 y/o woman with h/o ETOH/tobacco use and undiagnosed severe HTN.   Transferred from [facility]  On 06/18/19 by Dr. Tomie China with new onset systolic HF EF 15-20% on 06/18/19. Diuresed well from 157 pounds to 147 pounds. Underwent R/L cath on 06/20/19. Normal coronaries. RHC as below. Initially felt to be possible ETOH CM but LV not that dilated and RV ok. While in hospital BP very high and meds titrated. Felt to be HTN cardiomyopathy. Cardiac MRI done on the morning of d/c.   Today she returns for HF follow up. Overall feeling fine. Gets a little tired after walking 2 blocks. Mild SOB every now and then.  Denies SOB/PND/Orthopnea. Appetite ok. No fever or chills. Weight at home 147-150 pounds. Taking all medications. She stopped smoking and drinking alcohol.  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 Lives with her husband and 3 children.   Cardiac Testing ECHO 7/20202 LVEF 15-20 %. The cavity size was normal. Left ventricular diastolic Doppler parameters are consistent with pseudonormalization. RV normal systolic function.  CMRI 7/11/20202 1.  Moderately dilated LV with EF 21%, diffuse hypokinesis. 2. Mildly dilated RV with moderately decreased systolic function, EF 32% 3. No myocardial LGE, so no definitive evidence for prior MI, infiltrative disease, or myocarditis. RHC/LHC 06/2019 Ao = 133/97 (114) LV = 130/22 RA = 2 RV = 43/2 PA = 43/21 (31) PCW = 20 Fick cardiac output/index =4.4/2.5 PVR = 2.5 WU SVR = 2046 Ao sat = 97% PA sat = 71%, 71% Assessment: 1. Normal coronary arteries 2. 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 Mild to moderately depressed CO 4. Filling pressures much improved but SVR very high    ROS: All systems negative except as listed in HPI, PMH and Problem List.  SH:  Social History   Socioeconomic History  . Marital status: Married    Spouse name: Not on file  .  Number of children: Not on file  . Years of education: Not on file  . Highest education level: Not on file  Occupational History  . Not on file  Social Needs  . Financial resource strain: Not hard at all  . Food insecurity    Worry: Never true    Inability: Never true  . Transportation needs    Medical: No    Non-medical: No  Tobacco Use  . Smoking status: Former Smoker    Types: Cigarettes    Quit date: 04/17/2019    Years since quitting: 0.2  . Smokeless tobacco: Never Used  Substance and Sexual Activity  . Alcohol use: Not Currently    Alcohol/week: 8.0 standard drinks    Types: 8 Cans of beer per week    Comment: quit 5/20  . Drug use: Never  . Sexual activity: Not on file  Lifestyle  . Physical activity    Days per week: 0 days    Minutes per session: Not on file  . Stress: Not at all  Relationships  . Social Musician on phone: Not on file    Gets together: Not on file    Attends religious service: Not on file    Active member of club or organization: Not on file    Attends meetings of clubs or organizations: Never    Relationship status: Married  . Intimate partner violence    Fear of current or ex partner: No  Emotionally abused: No    Physically abused: No    Forced sexual activity: No  Other Topics Concern  . Not on file  Social History Narrative  . Not on file    FH:  Family History  Problem Relation Age of Onset  . Diabetes Mother   . Heart disease Father   . Heart disease Brother     Past Medical History:  Diagnosis Date  . Acute systolic (congestive) heart failure ([place])    Onset 06/2019  . Alcohol abuse    6 beers every other day x 8 years   . Tobacco abuse     Current Outpatient Medications  Medication Sig Dispense Refill  . carvedilol (COREG) 3.125 MG tablet Take 1 tablet (3.125 mg total) by mouth 2 (two) times daily. 60 tablet 0  . digoxin (LANOXIN) 0.125 MG tablet Take 1 tablet (0.125 mg total) by mouth daily. 30 tablet  0  . furosemide (LASIX) 40 MG tablet Take 1 tablet (40 mg total) by mouth daily. 30 tablet 0  . sacubitril-valsartan (ENTRESTO) 97-103 MG Take 1 tablet by mouth 2 (two) times daily. 60 tablet 0  . spironolactone (ALDACTONE) 25 MG tablet Take 1 tablet (25 mg total) by mouth daily. 30 tablet 0   No current facility-administered medications for this visit.     Vitals:   07/03/19 1417  BP: 121/72  Pulse: 68  SpO2: 99%  Weight: 68.5 kg (151 lb)   Wt Readings from Last 3 Encounters:  07/03/19 68.5 kg (151 lb)  06/22/19 66.7 kg (147 lb 0.8 oz)    PHYSICAL EXAM: General:  Well appearing. No resp difficulty HEENT: normal Neck: supple. JVP flat. Carotids 2+ bilaterally; no bruits. No lymphadenopathy or thryomegaly appreciated. Cor: PMI normal. Regular rate & rhythm. No rubs, gallops or murmurs. Lungs: clear Abdomen: soft, nontender, nondistended. No hepatosplenomegaly. No bruits or masses. Good bowel sounds. Extremities: no cyanosis, clubbing, rash, edema Neuro: alert & orientedx3, cranial nerves grossly intact. Moves all 4 extremities w/o difficulty. Affect pleasant.   ASSESSMENT & PLAN: 1. Acute systolic HF - suspect HTN CM - echo at [facility] 7/6 showed EF 15% with mild RV dysfunction - echo 15-25 with mild to mod MR  - cath 7/8 with normal cors.  - CMRI EF 21%. No evidence infiltrative disease.  - Plan to repeat ECHO in October after HF meds optimized. Narrow QRS so no  NYHA II-IIIb. Volume status stable. Continue lasix 40 mg daily + 25 mg spironolactone daily.  - Continue 3.125 mg carvedilol twice a day.  -Continue digoxin 0.125 mg daily.  - On goal dose entresto.  - Add 1 tab bidil three times a day.   2. H/o ETOH abuse - quit in May - Congratulated on cessation.    3. HTN -Stable. Continue current regimen   4. Former tobacco abuse.  Quit in May . Congratulated on cessation.    Check BMET today.   Discussed medication changes, MRI results and plan for ECHO later  in October. Provided with letter for work to remain out of work for now.   Follow up in 3 weeks.   Aksel Bencomo  NP-C  8:24 PM

## 2019-07-03 ENCOUNTER — Encounter (HOSPITAL_COMMUNITY): Payer: Self-pay

## 2019-07-03 ENCOUNTER — Encounter (HOSPITAL_COMMUNITY): Payer: Self-pay | Admitting: *Deleted

## 2019-07-03 ENCOUNTER — Other Ambulatory Visit: Payer: Self-pay

## 2019-07-03 ENCOUNTER — Ambulatory Visit (HOSPITAL_COMMUNITY)
Admission: RE | Admit: 2019-07-03 | Discharge: 2019-07-03 | Disposition: A | Discharge status: Home / Self Care | Payer: Commercial Managed Care - PPO | Source: Ambulatory Visit | Attending: Cardiology | Admitting: Cardiology

## 2019-07-03 VITALS — BP 121/72 | HR 68 | Wt 151.0 lb

## 2019-07-03 DIAGNOSIS — Z8249 Family history of ischemic heart disease and other diseases of the circulatory system: Secondary | ICD-10-CM | POA: Insufficient documentation

## 2019-07-03 DIAGNOSIS — Z87891 Personal history of nicotine dependence: Secondary | ICD-10-CM | POA: Insufficient documentation

## 2019-07-03 DIAGNOSIS — I110 Hypertensive heart disease with heart failure: Secondary | ICD-10-CM | POA: Diagnosis not present

## 2019-07-03 DIAGNOSIS — I5022 Chronic systolic (congestive) heart failure: Secondary | ICD-10-CM | POA: Diagnosis present

## 2019-07-03 DIAGNOSIS — I428 Other cardiomyopathies: Secondary | ICD-10-CM | POA: Insufficient documentation

## 2019-07-03 DIAGNOSIS — F1010 Alcohol abuse, uncomplicated: Secondary | ICD-10-CM | POA: Diagnosis not present

## 2019-07-03 DIAGNOSIS — Z79899 Other long term (current) drug therapy: Secondary | ICD-10-CM | POA: Insufficient documentation

## 2019-07-03 DIAGNOSIS — I10 Essential (primary) hypertension: Secondary | ICD-10-CM | POA: Diagnosis not present

## 2019-07-03 MED ORDER — ISOSORB DINITRATE-HYDRALAZINE 20-37.5 MG PO TABS: 1.0000 | ORAL_TABLET | Freq: Three times a day (TID) | ORAL | 3 refills | Status: DC

## 2019-07-03 NOTE — Patient Instructions (Addendum)
Lab work done today. We will notify you of any abnormal labs. No news is good news!  START Bidil 1 tab three times daily.  You have been given a letter stating that you should not return to work until further notice.  Please follow up with the [facility] in 3 weeks.  At the [facility], you and your health needs are our priority. As part of our continuing mission to provide you with exceptional heart care, we have created designated Provider Care Teams. These Care Teams include your primary Cardiologist (physician) and Advanced Practice Providers (APPs- Physician Assistants and Nurse Practitioners) who all work together to provide you with the care you need, when you need it.   You may see any of the following providers on your designated Care Team at your next follow up: Marland Kitchen Dr Glori Bickers . Dr Loralie Champagne . Darrick Grinder, NP

## 2019-07-24 ENCOUNTER — Encounter (HOSPITAL_COMMUNITY): Payer: Self-pay

## 2019-07-24 ENCOUNTER — Ambulatory Visit (HOSPITAL_COMMUNITY)
Admission: RE | Admit: 2019-07-24 | Discharge: 2019-07-24 | Disposition: A | Discharge status: Home / Self Care | Payer: Commercial Managed Care - PPO | Source: Ambulatory Visit | Attending: Cardiology | Admitting: Cardiology

## 2019-07-24 ENCOUNTER — Other Ambulatory Visit: Payer: Self-pay

## 2019-07-24 VITALS — BP 136/94 | HR 88 | Wt 155.2 lb

## 2019-07-24 DIAGNOSIS — Z79899 Other long term (current) drug therapy: Secondary | ICD-10-CM | POA: Insufficient documentation

## 2019-07-24 DIAGNOSIS — Z87891 Personal history of nicotine dependence: Secondary | ICD-10-CM | POA: Diagnosis not present

## 2019-07-24 DIAGNOSIS — Z8249 Family history of ischemic heart disease and other diseases of the circulatory system: Secondary | ICD-10-CM | POA: Insufficient documentation

## 2019-07-24 DIAGNOSIS — I110 Hypertensive heart disease with heart failure: Secondary | ICD-10-CM | POA: Diagnosis not present

## 2019-07-24 DIAGNOSIS — I428 Other cardiomyopathies: Secondary | ICD-10-CM | POA: Diagnosis not present

## 2019-07-24 DIAGNOSIS — I10 Essential (primary) hypertension: Secondary | ICD-10-CM

## 2019-07-24 DIAGNOSIS — I5021 Acute systolic (congestive) heart failure: Secondary | ICD-10-CM | POA: Diagnosis present

## 2019-07-24 DIAGNOSIS — I5022 Chronic systolic (congestive) heart failure: Secondary | ICD-10-CM

## 2019-07-24 DIAGNOSIS — F1010 Alcohol abuse, uncomplicated: Secondary | ICD-10-CM | POA: Diagnosis not present

## 2019-07-24 MED ORDER — ISOSORB DINITRATE-HYDRALAZINE 20-37.5 MG PO TABS: 2.0000 | ORAL_TABLET | Freq: Three times a day (TID) | ORAL | 3 refills | Status: AC

## 2019-07-24 NOTE — Addendum Note (Signed)
Encounter addended by: Marlise Eves, RN on: 07/24/2019 11:44 AM  Actions taken: Order list changed, Diagnosis association updated

## 2019-07-24 NOTE — Patient Instructions (Signed)
INCREASE Bidil to 2 tabs three times daily.  Your physician has requested that you have an echocardiogram. Echocardiography is a painless test that uses sound waves to create images of your heart. It provides your doctor with information about the size and shape of your heart and how well your heart's chambers and valves are working. This procedure takes approximately one hour. There are no restrictions for this procedure. This will be done at your follow up in mid October.    Please follow up with the [facility] in 4 weeks and then again in mid October.   At the [facility], you and your health needs are our priority. As part of our continuing mission to provide you with exceptional heart care, we have created designated Provider Care Teams. These Care Teams include your primary Cardiologist (physician) and Advanced Practice Providers (APPs- Physician Assistants and Nurse Practitioners) who all work together to provide you with the care you need, when you need it.   You may see any of the following providers on your designated Care Team at your next follow up: Marland Kitchen Dr Glori Bickers . Dr Loralie Champagne . Darrick Grinder, NP   Please be sure to bring in all your medications bottles to every appointment.

## 2019-07-24 NOTE — Progress Notes (Signed)
PCP: None  Primary HF Cardiologist: Dr Haroldine Laws  HPI: 48 y/o woman with h/o ETOH/tobacco use and undiagnosed severe HTN.   Transferred from [facility]  On 06/18/19 by Dr. Geraldo Pitter with new onset systolic HF EF 75-91% on 05/16/83. Diuresed well from 157 pounds to 147 pounds. Underwent R/L cath on 06/20/19. Normal coronaries. RHC as below. Initially felt to be possible ETOH CM but LV not that dilated and RV ok. While in hospital BP very high and meds titrated. Felt to be HTN cardiomyopathy. Cardiac MRI done on the morning of d/c.   Today she returns for Hf follow up. Last visit bidil was started. Overall feeling fine. Mild SOB with inclines. Denies PND/Orthopnea. Appetite ok. No fever or chills. Weight at home 150-152  pounds. Walking 4 miles 3-4 days a week. Taking all medications.   Cardiac Testing ECHO 7/20202 LVEF 15-20 %. The cavity size was normal. Left ventricular diastolic Doppler parameters are consistent with pseudonormalization. RV normal systolic function.  CMRI 7/11/20202 1.  Moderately dilated LV with EF 21%, diffuse hypokinesis. 2. Mildly dilated RV with moderately decreased systolic function, EF 66% 3. No myocardial LGE, so no definitive evidence for prior MI, infiltrative disease, or myocarditis. RHC/LHC 06/2019 Ao = 133/97 (114) LV = 130/22 RA = 2 RV = 43/2 PA = 43/21 (31) PCW = 20 Fick cardiac output/index =4.4/2.5 PVR = 2.5 WU SVR = 2046 Ao sat = 97% PA sat = 71%, 71% Assessment: 1. Normal coronary arteries 2. 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 Mild to moderately depressed CO 4. Filling pressures much improved but SVR very high    ROS: All systems negative except as listed in HPI, PMH and Problem List.  SH:  Social History   Socioeconomic History  . Marital status: Married    Spouse name: Not on file  . Number of children: Not on file  . Years of education: Not on file  . Highest education level: Not on file  Occupational History  . Not on file   Social Needs  . Financial resource strain: Not hard at all  . Food insecurity    Worry: Never true    Inability: Never true  . Transportation needs    Medical: No    Non-medical: No  Tobacco Use  . Smoking status: Former Smoker    Types: Cigarettes    Quit date: 04/17/2019    Years since quitting: 0.2  . Smokeless tobacco: Never Used  Substance and Sexual Activity  . Alcohol use: Not Currently    Alcohol/week: 8.0 standard drinks    Types: 8 Cans of beer per week    Comment: quit 5/20  . Drug use: Never  . Sexual activity: Not on file  Lifestyle  . Physical activity    Days per week: 0 days    Minutes per session: Not on file  . Stress: Not at all  Relationships  . Social Herbalist on phone: Not on file    Gets together: Not on file    Attends religious service: Not on file    Active member of club or organization: Not on file    Attends meetings of clubs or organizations: Never    Relationship status: Married  . Intimate partner violence    Fear of current or ex partner: No    Emotionally abused: No    Physically abused: No    Forced sexual activity: No  Other Topics Concern  . Not on file  Social History Narrative  . Not on file    FH:  Family History  Problem Relation Age of Onset  . Diabetes Mother   . Heart disease Father   . Heart disease Brother     Past Medical History:  Diagnosis Date  . Acute systolic (congestive) heart failure ([facility])    Onset 06/2019  . Alcohol abuse    6 beers every other day x 8 years   . Tobacco abuse     Current Outpatient Medications  Medication Sig Dispense Refill  . carvedilol (COREG) 3.125 MG tablet Take 1 tablet (3.125 mg total) by mouth 2 (two) times daily. 60 tablet 0  . digoxin (LANOXIN) 0.125 MG tablet Take 1 tablet (0.125 mg total) by mouth daily. 30 tablet 0  . furosemide (LASIX) 40 MG tablet Take 1 tablet (40 mg total) by mouth daily. 30 tablet 0  . isosorbide-hydrALAZINE (BIDIL) 20-37.5 MG tablet  Take 1 tablet by mouth 3 (three) times daily. 90 tablet 3  . sacubitril-valsartan (ENTRESTO) 97-103 MG Take 1 tablet by mouth 2 (two) times daily. 60 tablet 0  . spironolactone (ALDACTONE) 25 MG tablet Take 1 tablet (25 mg total) by mouth daily. 30 tablet 0   No current facility-administered medications for this encounter.     Vitals:   07/24/19 1118  BP: (!) 136/94  Pulse: 88  SpO2: 97%  Weight: 70.4 kg (155 lb 3.2 oz)   Wt Readings from Last 3 Encounters:  07/24/19 70.4 kg (155 lb 3.2 oz)  07/03/19 68.5 kg (151 lb)  06/22/19 66.7 kg (147 lb 0.8 oz)    PHYSICAL EXAM: General:  Well appearing. No resp difficulty HEENT: normal Neck: supple. no JVD. Carotids 2+ bilat; no bruits. No lymphadenopathy or thryomegaly appreciated. Cor: PMI nondisplaced. Regular rate & rhythm. No rubs, gallops or murmurs. Lungs: clear Abdomen: soft, nontender, nondistended. No hepatosplenomegaly. No bruits or masses. Good bowel sounds. Extremities: no cyanosis, clubbing, rash, edema Neuro: alert & orientedx3, cranial nerves grossly intact. moves all 4 extremities w/o difficulty. Affect pleasant  ASSESSMENT & PLAN: 1. Acute systolic HF - suspect HTN CM - echo at [facility] 7/6 showed EF 15% with mild RV dysfunction - echo 15-25 with mild to mod MR  - cath 7/8 with normal cors.  - CMRI EF 21%. No evidence infiltrative disease.  - Plan to repeat ECHO in October after HF meds optimized. Narrow QRS so no CRT-D   - NYHA II . Continue lasix 40 mg daily + 25 mg spironolactone daily.  - Continue 3.125 mg carvedilol twice a day.  -Continue digoxin 0.125 mg daily.  - On goal dose entresto.  -Increase bidil to 2 tab bidil three times a day.   2. H/o ETOH abuse - quit in May - Congratulated on cessation.    3. HTN -Better controlled.   4. Former tobacco abuse.  Quit in May . Congratulated on cessation.    Follow up in 4 weeks with APP and 8 weeks with Dr Shirlee Latch /ECHO.   Amy Clegg  NP-C  11:29 AM

## 2019-08-06 ENCOUNTER — Telehealth (HOSPITAL_COMMUNITY): Payer: Self-pay

## 2019-08-06 NOTE — Telephone Encounter (Signed)
Filled out disability form for [employer] absence management services. Faxed to [phone]. Fax confirmation received. Original to be scanned into chart.

## 2019-08-17 NOTE — Telephone Encounter (Signed)
Received request for further paper work from [employer]. Filled out form and faxed back to [phone]. fax confirmation received.

## 2019-08-21 NOTE — Progress Notes (Signed)
PCP: None  Primary HF Cardiologist: Dr Haroldine Laws  HPI: 48 y/o woman with h/o ETOH/tobacco use and undiagnosed severe HTN.   Transferred from [facility]  On 06/18/19 by Dr. Geraldo Pitter with new onset systolic HF EF 36-14% on 03/15/14. Diuresed well from 157 pounds to 147 pounds. Underwent R/L cath on 06/20/19. Normal coronaries. RHC as below. Initially felt to be possible ETOH CM but LV not that dilated and RV ok. While in hospital BP very high and meds titrated. Felt to be HTN cardiomyopathy. Cardiac MRI done on the morning of d/c.   Today she returns for HF follow up.Last visit bidil was increased to 2 tab three times a day. Overall feeling ok. Complaining of fatigue. SOB with exertion.  Denies PND/Orthopnea. Increased abdominal bloating. Appetite ok. No fever or chills. Weight at home has been trending up 162-163  pounds. She has been out of all medications except for bidil. Says she requested refills from pharmacy. Rarely smoking. Has not been drinking alcohol.   Cardiac Testing ECHO 7/20202 LVEF 15-20 %. The cavity size was normal. Left ventricular diastolic Doppler parameters are consistent with pseudonormalization. RV normal systolic function.  CMRI 7/11/20202 1.  Moderately dilated LV with EF 21%, diffuse hypokinesis. 2. Mildly dilated RV with moderately decreased systolic function, EF 40% 3. No myocardial LGE, so no definitive evidence for prior MI, infiltrative disease, or myocarditis. RHC/LHC 06/2019 Ao = 133/97 (114) LV = 130/22 RA = 2 RV = 43/2 PA = 43/21 (31) PCW = 20 Fick cardiac output/index =4.4/2.5 PVR = 2.5 WU SVR = 2046 Ao sat = 97% PA sat = 71%, 71% Assessment: 1. Normal coronary arteries 2. 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 Mild to moderately depressed CO 4. Filling pressures much improved but SVR very high    ROS: All systems negative except as listed in HPI, PMH and Problem List.  SH:  Social History   Socioeconomic History  . Marital status: Married   Spouse name: Not on file  . Number of children: Not on file  . Years of education: Not on file  . Highest education level: Not on file  Occupational History  . Not on file  Social Needs  . Financial resource strain: Not hard at all  . Food insecurity    Worry: Never true    Inability: Never true  . Transportation needs    Medical: No    Non-medical: No  Tobacco Use  . Smoking status: Former Smoker    Types: Cigarettes    Quit date: 04/17/2019    Years since quitting: 0.3  . Smokeless tobacco: Never Used  Substance and Sexual Activity  . Alcohol use: Not Currently    Alcohol/week: 8.0 standard drinks    Types: 8 Cans of beer per week    Comment: quit 5/20  . Drug use: Never  . Sexual activity: Not on file  Lifestyle  . Physical activity    Days per week: 0 days    Minutes per session: Not on file  . Stress: Not at all  Relationships  . Social Herbalist on phone: Not on file    Gets together: Not on file    Attends religious service: Not on file    Active member of club or organization: Not on file    Attends meetings of clubs or organizations: Never    Relationship status: Married  . Intimate partner violence    Fear of current or ex partner: No  Emotionally abused: No    Physically abused: No    Forced sexual activity: No  Other Topics Concern  . Not on file  Social History Narrative  . Not on file    FH:  Family History  Problem Relation Age of Onset  . Diabetes Mother   . Heart disease Father   . Heart disease Brother     Past Medical History:  Diagnosis Date  . Acute systolic (congestive) heart failure ([facility])    Onset 06/2019  . Alcohol abuse    6 beers every other day x 8 years   . Tobacco abuse     Current Outpatient Medications  Medication Sig Dispense Refill  . isosorbide-hydrALAZINE (BIDIL) 20-37.5 MG tablet Take 2 tablets by mouth 3 (three) times daily. 270 tablet 3  . carvedilol (COREG) 3.125 MG tablet Take 1 tablet (3.125 mg  total) by mouth 2 (two) times daily. (Patient not taking: Reported on 08/22/2019) 60 tablet 0  . digoxin (LANOXIN) 0.125 MG tablet Take 1 tablet (0.125 mg total) by mouth daily. (Patient not taking: Reported on 08/22/2019) 30 tablet 0  . furosemide (LASIX) 40 MG tablet Take 1 tablet (40 mg total) by mouth daily. (Patient not taking: Reported on 08/22/2019) 30 tablet 0  . sacubitril-valsartan (ENTRESTO) 97-103 MG Take 1 tablet by mouth 2 (two) times daily. (Patient not taking: Reported on 08/22/2019) 60 tablet 0  . spironolactone (ALDACTONE) 25 MG tablet Take 1 tablet (25 mg total) by mouth daily. (Patient not taking: Reported on 08/22/2019) 30 tablet 0   No current facility-administered medications for this encounter.     Vitals:   08/22/19 1100  BP: 131/82  Pulse: (!) 108  SpO2: 98%  Weight: 74.5 kg (164 lb 3.2 oz)   Wt Readings from Last 3 Encounters:  08/22/19 74.5 kg (164 lb 3.2 oz)  07/24/19 70.4 kg (155 lb 3.2 oz)  07/03/19 68.5 kg (151 lb)    PHYSICAL EXAM: General:  Well appearing. No resp difficulty HEENT: normal Neck: supple. 11-12 JVP. Carotids 2+ bilat; no bruits. No lymphadenopathy or thryomegaly appreciated. Cor: PMI nondisplaced. Regular rate & rhythm. No rubs, gallops or murmurs. Lungs: clear Abdomen: soft, nontender, distended. No hepatosplenomegaly. No bruits or masses. Good bowel sounds. Extremities: no cyanosis, clubbing, rash, traceedema Neuro: alert & orientedx3, cranial nerves grossly intact. moves all 4 extremities w/o difficulty. Affect pleasant  ASSESSMENT & PLAN: 1. Acute systolic HF - suspect HTN CM - echo at [facility] 7/6 showed EF 15% with mild RV dysfunction - echo 15-25 with mild to mod MR  - cath 7/8 with normal cors.  - CMRI EF 21%. No evidence infiltrative disease.  - Plan to repeat ECHO in October after HF meds optimized. Narrow QRS so no CRT-D   -NYHA II-III. Volume status trending up.  Today I am restarting lasix 40 mg daily, 25 mg spironolactone  daily, 3.125 mg carvedilol twice a day, digoxin 0.125 mg daily, entresto 97-103 twice a day.   - Continue bidil to 2 tab bidil three times a day.   2. H/o ETOH abuse - quit in May - Congratulated on cessation.   - Has not had any alcohol.   3. HTN Restarting meds as above.   4.Tobacco abuse Discussed smoking cessation.    Follow up next week to reassess. Check BMEt and EKG at that time. We sent in refill for all medications.    Amy Clegg  NP-C  11:15 AM

## 2019-08-22 ENCOUNTER — Ambulatory Visit (HOSPITAL_COMMUNITY)
Admission: RE | Admit: 2019-08-22 | Discharge: 2019-08-22 | Disposition: A | Discharge status: Home / Self Care | Payer: Commercial Managed Care - PPO | Source: Ambulatory Visit | Attending: Cardiology | Admitting: Cardiology

## 2019-08-22 ENCOUNTER — Encounter (HOSPITAL_COMMUNITY): Payer: Self-pay

## 2019-08-22 ENCOUNTER — Other Ambulatory Visit: Payer: Self-pay

## 2019-08-22 VITALS — BP 131/82 | HR 108 | Wt 164.2 lb

## 2019-08-22 DIAGNOSIS — I10 Essential (primary) hypertension: Secondary | ICD-10-CM

## 2019-08-22 DIAGNOSIS — I428 Other cardiomyopathies: Secondary | ICD-10-CM | POA: Diagnosis not present

## 2019-08-22 DIAGNOSIS — F1010 Alcohol abuse, uncomplicated: Secondary | ICD-10-CM | POA: Diagnosis not present

## 2019-08-22 DIAGNOSIS — I5021 Acute systolic (congestive) heart failure: Secondary | ICD-10-CM | POA: Insufficient documentation

## 2019-08-22 DIAGNOSIS — I110 Hypertensive heart disease with heart failure: Secondary | ICD-10-CM | POA: Insufficient documentation

## 2019-08-22 DIAGNOSIS — Z87891 Personal history of nicotine dependence: Secondary | ICD-10-CM | POA: Diagnosis not present

## 2019-08-22 DIAGNOSIS — Z8249 Family history of ischemic heart disease and other diseases of the circulatory system: Secondary | ICD-10-CM | POA: Diagnosis not present

## 2019-08-22 DIAGNOSIS — I5022 Chronic systolic (congestive) heart failure: Secondary | ICD-10-CM | POA: Diagnosis not present

## 2019-08-22 DIAGNOSIS — Z833 Family history of diabetes mellitus: Secondary | ICD-10-CM | POA: Insufficient documentation

## 2019-08-22 DIAGNOSIS — Z79899 Other long term (current) drug therapy: Secondary | ICD-10-CM | POA: Diagnosis not present

## 2019-08-22 MED ORDER — FUROSEMIDE 40 MG PO TABS: 40.0000 mg | ORAL_TABLET | Freq: Every day | ORAL | 6 refills | Status: DC

## 2019-08-22 MED ORDER — CARVEDILOL 3.125 MG PO TABS: 3.1250 mg | ORAL_TABLET | Freq: Two times a day (BID) | ORAL | 6 refills | Status: DC

## 2019-08-22 MED ORDER — DIGOXIN 125 MCG PO TABS: 0.1250 mg | ORAL_TABLET | Freq: Every day | ORAL | 6 refills | Status: DC

## 2019-08-22 MED ORDER — SPIRONOLACTONE 25 MG PO TABS: 25.0000 mg | ORAL_TABLET | Freq: Every day | ORAL | 6 refills | Status: AC

## 2019-08-22 MED ORDER — SACUBITRIL-VALSARTAN 97-103 MG PO TABS: 1.0000 | ORAL_TABLET | Freq: Two times a day (BID) | ORAL | 6 refills | Status: DC

## 2019-08-22 NOTE — Patient Instructions (Signed)
No medication changes are needed at this time, please be sure to restart all medications    Your physician recommends that you schedule a follow-up appointment in: 1 week with Amy Clegg,NP  Do the following things EVERYDAY: 1) Weigh yourself in the morning before breakfast. Write it down and keep it in a log. 2) Take your medicines as prescribed 3) Eat low salt foods-Limit salt (sodium) to 2000 mg per day.  4) Stay as active as you can everyday 5) Limit all fluids for the day to less than 2 liters   At the [facility], you and your health needs are our priority. As part of our continuing mission to provide you with exceptional heart care, we have created designated Provider Care Teams. These Care Teams include your primary Cardiologist (physician) and Advanced Practice Providers (APPs- Physician Assistants and Nurse Practitioners) who all work together to provide you with the care you need, when you need it.   You may see any of the following providers on your designated Care Team at your next follow up: Marland Kitchen Dr Glori Bickers . Dr Loralie Champagne . Darrick Grinder, NP   Please be sure to bring in all your medications bottles to every appointment.

## 2019-08-23 ENCOUNTER — Telehealth (HOSPITAL_COMMUNITY): Payer: Self-pay

## 2019-08-23 MED ORDER — FUROSEMIDE 40 MG PO TABS: 40.0000 mg | ORAL_TABLET | Freq: Every day | ORAL | 6 refills | Status: DC

## 2019-08-23 MED ORDER — SACUBITRIL-VALSARTAN 97-103 MG PO TABS: 1.0000 | ORAL_TABLET | Freq: Two times a day (BID) | ORAL | 6 refills | Status: AC

## 2019-08-23 NOTE — Telephone Encounter (Signed)
Pt reports that she was in office yesterday and had all meds sent to her pharmacy however pharmacy did not have lasix and entresto order.  Prescription for both meds resent. Pt appreciative.

## 2019-08-27 ENCOUNTER — Other Ambulatory Visit: Payer: Self-pay

## 2019-08-27 ENCOUNTER — Telehealth (HOSPITAL_COMMUNITY): Payer: Self-pay | Admitting: Vascular Surgery

## 2019-08-27 ENCOUNTER — Ambulatory Visit (HOSPITAL_COMMUNITY)
Admission: RE | Admit: 2019-08-27 | Discharge: 2019-08-27 | Disposition: A | Discharge status: Home / Self Care | Payer: Commercial Managed Care - PPO | Source: Ambulatory Visit | Attending: Internal Medicine | Admitting: Internal Medicine

## 2019-08-27 ENCOUNTER — Telehealth (HOSPITAL_COMMUNITY): Payer: Self-pay

## 2019-08-27 VITALS — BP 122/84 | HR 82 | Wt 160.6 lb

## 2019-08-27 DIAGNOSIS — I5021 Acute systolic (congestive) heart failure: Secondary | ICD-10-CM | POA: Insufficient documentation

## 2019-08-27 DIAGNOSIS — Z79899 Other long term (current) drug therapy: Secondary | ICD-10-CM | POA: Insufficient documentation

## 2019-08-27 DIAGNOSIS — Z87891 Personal history of nicotine dependence: Secondary | ICD-10-CM | POA: Diagnosis not present

## 2019-08-27 DIAGNOSIS — I110 Hypertensive heart disease with heart failure: Secondary | ICD-10-CM | POA: Insufficient documentation

## 2019-08-27 DIAGNOSIS — Z8249 Family history of ischemic heart disease and other diseases of the circulatory system: Secondary | ICD-10-CM | POA: Diagnosis not present

## 2019-08-27 DIAGNOSIS — F1011 Alcohol abuse, in remission: Secondary | ICD-10-CM | POA: Diagnosis not present

## 2019-08-27 DIAGNOSIS — F1010 Alcohol abuse, uncomplicated: Secondary | ICD-10-CM | POA: Diagnosis not present

## 2019-08-27 DIAGNOSIS — I5022 Chronic systolic (congestive) heart failure: Secondary | ICD-10-CM

## 2019-08-27 DIAGNOSIS — I10 Essential (primary) hypertension: Secondary | ICD-10-CM

## 2019-08-27 DIAGNOSIS — I428 Other cardiomyopathies: Secondary | ICD-10-CM | POA: Diagnosis not present

## 2019-08-27 LAB — BASIC METABOLIC PANEL
Anion gap: 12 (ref 5–15)
BUN: 12 mg/dL (ref 6–20)
CO2: 21 mmol/L — ABNORMAL LOW (ref 22–32)
Calcium: 9.5 mg/dL (ref 8.9–10.3)
Chloride: 103 mmol/L (ref 98–111)
Creatinine, Ser: 0.81 mg/dL (ref 0.44–1.00)
GFR calc Af Amer: >60 mL/min (ref >60)
GFR calc non Af Amer: >60 mL/min (ref >60)
Glucose, Bld: 98 mg/dL (ref 70–99)
Potassium: 3.9 mmol/L (ref 3.5–5.1)
Sodium: 136 mmol/L (ref 135–145)

## 2019-08-27 MED ORDER — CARVEDILOL 6.25 MG PO TABS: 6.2500 mg | ORAL_TABLET | Freq: Two times a day (BID) | ORAL | 3 refills | Status: AC

## 2019-08-27 NOTE — Patient Instructions (Signed)
INCREASE Coreg to 6.25mg  (1 tab) twice a day  Labs today We will only contact you if something comes back abnormal or we need to make some changes. Otherwise no news is good news!  Your physician recommends that you schedule a follow-up appointment in: Keep your next appointment with Dr Aundra Dubin  At the [facility], you and your health needs are our priority. As part of our continuing mission to provide you with exceptional heart care, we have created designated Provider Care Teams. These Care Teams include your primary Cardiologist (physician) and Advanced Practice Providers (APPs- Physician Assistants and Nurse Practitioners) who all work together to provide you with the care you need, when you need it.   You may see any of the following providers on your designated Care Team at your next follow up: Marland Kitchen Dr Glori Bickers . Dr Loralie Champagne . Darrick Grinder, NP   Please be sure to bring in all your medications bottles to every appointment.

## 2019-08-27 NOTE — Progress Notes (Signed)
PCP: None  Primary HF Cardiologist: Dr Gala Romney  HPI: 48 y/o woman with h/o ETOH/tobacco use and undiagnosed severe HTN.   Transferred from [facility]  On 06/18/19 by Dr. Tomie China with new onset systolic HF EF 15-20% on 06/18/19. Diuresed well from 157 pounds to 147 pounds. Underwent R/L cath on 06/20/19. Normal coronaries. RHC as below. Initially felt to be possible ETOH CM but LV not that dilated and RV ok. While in hospital BP very high and meds titrated. Felt to be HTN cardiomyopathy. Cardiac MRI done on the morning of d/c.   Today she returns for HF follow up. Last week she was seen in HF clinic and was out of all medications except for bidil. At that time she was short of breath and had volume overload. Overall feeling much better. Denies SOB/PND/Orthopnea. Appetite ok. No fever or chills. Weight at home has been stable 158-159  pounds. Taking all medications. Says she is not drinking alcohol. She has not been smoking over the last month.   Cardiac Testing ECHO 7/20202 LVEF 15-20 %. The cavity size was normal. Left ventricular diastolic Doppler parameters are consistent with pseudonormalization. RV normal systolic function.  CMRI 7/11/20202 1.  Moderately dilated LV with EF 21%, diffuse hypokinesis. 2. Mildly dilated RV with moderately decreased systolic function, EF 32% 3. No myocardial LGE, so no definitive evidence for prior MI, infiltrative disease, or myocarditis. RHC/LHC 06/2019 Ao = 133/97 (114) LV = 130/22 RA = 2 RV = 43/2 PA = 43/21 (31) PCW = 20 Fick cardiac output/index =4.4/2.5 PVR = 2.5 WU SVR = 2046 Ao sat = 97% PA sat = 71%, 71% Assessment: 1. Normal coronary arteries 2. 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 Mild to moderately depressed CO 4. Filling pressures much improved but SVR very high    ROS: All systems negative except as listed in HPI, PMH and Problem List.  SH:  Social History   Socioeconomic History  . Marital status: Married    Spouse name: Not  on file  . Number of children: Not on file  . Years of education: Not on file  . Highest education level: Not on file  Occupational History  . Not on file  Social Needs  . Financial resource strain: Not hard at all  . Food insecurity    Worry: Never true    Inability: Never true  . Transportation needs    Medical: No    Non-medical: No  Tobacco Use  . Smoking status: Former Smoker    Types: Cigarettes    Quit date: 04/17/2019    Years since quitting: 0.3  . Smokeless tobacco: Never Used  Substance and Sexual Activity  . Alcohol use: Not Currently    Alcohol/week: 8.0 standard drinks    Types: 8 Cans of beer per week    Comment: quit 5/20  . Drug use: Never  . Sexual activity: Not on file  Lifestyle  . Physical activity    Days per week: 0 days    Minutes per session: Not on file  . Stress: Not at all  Relationships  . Social Musician on phone: Not on file    Gets together: Not on file    Attends religious service: Not on file    Active member of club or organization: Not on file    Attends meetings of clubs or organizations: Never    Relationship status: Married  . Intimate partner violence    Fear of current or  ex partner: No    Emotionally abused: No    Physically abused: No    Forced sexual activity: No  Other Topics Concern  . Not on file  Social History Narrative  . Not on file    FH:  Family History  Problem Relation Age of Onset  . Diabetes Mother   . Heart disease Father   . Heart disease Brother     Past Medical History:  Diagnosis Date  . Acute systolic (congestive) heart failure ([place])    Onset 06/2019  . Alcohol abuse    6 beers every other day x 8 years   . Tobacco abuse     Current Outpatient Medications  Medication Sig Dispense Refill  . carvedilol (COREG) 3.125 MG tablet Take 1 tablet (3.125 mg total) by mouth 2 (two) times daily. 60 tablet 6  . digoxin (LANOXIN) 0.125 MG tablet Take 1 tablet (0.125 mg total) by mouth  daily. 30 tablet 6  . furosemide (LASIX) 40 MG tablet Take 1 tablet (40 mg total) by mouth daily. 30 tablet 6  . isosorbide-hydrALAZINE (BIDIL) 20-37.5 MG tablet Take 2 tablets by mouth 3 (three) times daily. 270 tablet 3  . sacubitril-valsartan (ENTRESTO) 97-103 MG Take 1 tablet by mouth 2 (two) times daily. 60 tablet 6  . spironolactone (ALDACTONE) 25 MG tablet Take 1 tablet (25 mg total) by mouth daily. 30 tablet 6   No current facility-administered medications for this encounter.     Vitals:   08/27/19 1404  BP: 122/84  Pulse: 82  SpO2: 97%  Weight: 72.8 kg (160 lb 9.6 oz)   Wt Readings from Last 3 Encounters:  08/27/19 72.8 kg (160 lb 9.6 oz)  08/22/19 74.5 kg (164 lb 3.2 oz)  07/24/19 70.4 kg (155 lb 3.2 oz)    PHYSICAL EXAM: General:  Well appearing. No resp difficulty HEENT: normal Neck: supple. no JVD. Carotids 2+ bilat; no bruits. No lymphadenopathy or thryomegaly appreciated. Cor: PMI nondisplaced. Regular rate & rhythm. No rubs, gallops or murmurs. Lungs: clear Abdomen: soft, nontender, nondistended. No hepatosplenomegaly. No bruits or masses. Good bowel sounds. Extremities: no cyanosis, clubbing, rash, edema Neuro: alert & orientedx3, cranial nerves grossly intact. moves all 4 extremities w/o difficulty. Affect pleasant   ASSESSMENT & PLAN: 1. Acute systolic HF - suspect HTN CM - echo [facility] 7/6 showed EF 15% with mild RV dysfunction - echo 15-25 with mild to mod MR  - cath 7/8 with normal cors.  - CMRI EF 21%. No evidence infiltrative disease.  - Check ECHO in October. Narrow QRS so no CRT-D   -NYHA II. Volume status stable. Continue lasix 40 mg daily - Continue 25 mg spironolactone daily - Increase coreg 6.25 mg twice a day.  - Continue  digoxin 0.125 mg daily - Continue entresto 97-103 twice a day.   - Continue bidil to 2 tab bidil three times a day.  - Check BMET today.   2. H/o ETOH abuse - quit in May - Congratulated on cessation.   -Denies  alcohol use.   3. HTN Restarting meds as above.   4.Tobacco abuse She has not been smoking. Congratulated.     Follow up in 4 weeks with Dr Haroldine Laws and an ECHO.   Kari Mier  NP-C  2:07 PM

## 2019-08-27 NOTE — Telephone Encounter (Signed)
Left pt VM , pt scheduled w/ [facility] in error, pt is a DB pt, pt was rescheduled w/ correct dr 10/22 @ 115 echo/ f.u w/ db 2:40

## 2019-08-27 NOTE — Telephone Encounter (Signed)
Received PA request for Lasix from [facility].  Called [employer] and they report that PA is not required for this medication.

## 2019-08-28 ENCOUNTER — Telehealth (HOSPITAL_COMMUNITY): Payer: Self-pay

## 2019-08-28 NOTE — Telephone Encounter (Signed)
Medical Records requested and faxed to [email or web address] to [phone] on 08/24/2019.

## 2019-09-07 ENCOUNTER — Telehealth (HOSPITAL_COMMUNITY): Payer: Self-pay

## 2019-09-07 NOTE — Telephone Encounter (Signed)
Filled out disability paper work for [employer]. Rough estimate of return to work is 10/08/2019. reeval on next appointment on 10/04/19. Paper work faxed to [employer]. Fax confirmation received. Original paper work to be scanned into chart.

## 2019-09-27 ENCOUNTER — Other Ambulatory Visit (HOSPITAL_COMMUNITY): Payer: Commercial Managed Care - PPO

## 2019-09-27 ENCOUNTER — Encounter (HOSPITAL_COMMUNITY): Payer: Commercial Managed Care - PPO | Admitting: Cardiology

## 2019-10-04 ENCOUNTER — Other Ambulatory Visit: Payer: Self-pay

## 2019-10-04 ENCOUNTER — Encounter (HOSPITAL_COMMUNITY): Payer: Self-pay | Admitting: Internal Medicine

## 2019-10-04 ENCOUNTER — Ambulatory Visit (HOSPITAL_COMMUNITY)
Admission: RE | Admit: 2019-10-04 | Discharge: 2019-10-04 | Disposition: A | Discharge status: Home / Self Care | Payer: Self-pay | Source: Ambulatory Visit | Attending: Adult Health | Admitting: Adult Health

## 2019-10-04 ENCOUNTER — Ambulatory Visit (HOSPITAL_COMMUNITY)
Admission: RE | Admit: 2019-10-04 | Discharge: 2019-10-04 | Disposition: A | Discharge status: Home / Self Care | Payer: Self-pay | Source: Ambulatory Visit | Attending: Cardiology | Admitting: Cardiology

## 2019-10-04 VITALS — BP 120/67 | HR 75 | Wt 159.4 lb

## 2019-10-04 DIAGNOSIS — I10 Essential (primary) hypertension: Secondary | ICD-10-CM | POA: Insufficient documentation

## 2019-10-04 DIAGNOSIS — I5022 Chronic systolic (congestive) heart failure: Secondary | ICD-10-CM

## 2019-10-04 DIAGNOSIS — Z87891 Personal history of nicotine dependence: Secondary | ICD-10-CM | POA: Insufficient documentation

## 2019-10-04 DIAGNOSIS — Z79899 Other long term (current) drug therapy: Secondary | ICD-10-CM | POA: Insufficient documentation

## 2019-10-04 DIAGNOSIS — I5021 Acute systolic (congestive) heart failure: Secondary | ICD-10-CM | POA: Insufficient documentation

## 2019-10-04 DIAGNOSIS — F1010 Alcohol abuse, uncomplicated: Secondary | ICD-10-CM

## 2019-10-04 DIAGNOSIS — Z833 Family history of diabetes mellitus: Secondary | ICD-10-CM | POA: Insufficient documentation

## 2019-10-04 DIAGNOSIS — F1011 Alcohol abuse, in remission: Secondary | ICD-10-CM | POA: Insufficient documentation

## 2019-10-04 DIAGNOSIS — Z8249 Family history of ischemic heart disease and other diseases of the circulatory system: Secondary | ICD-10-CM | POA: Insufficient documentation

## 2019-10-04 DIAGNOSIS — I428 Other cardiomyopathies: Secondary | ICD-10-CM | POA: Insufficient documentation

## 2019-10-04 LAB — BRAIN NATRIURETIC PEPTIDE: B Natriuretic Peptide: 18.5 pg/mL (ref 0.0–100.0)

## 2019-10-04 LAB — BASIC METABOLIC PANEL
Anion gap: 13 (ref 5–15)
BUN: 26 mg/dL — ABNORMAL HIGH (ref 6–20)
CO2: 19 mmol/L — ABNORMAL LOW (ref 22–32)
Calcium: 10.4 mg/dL — ABNORMAL HIGH (ref 8.9–10.3)
Chloride: 105 mmol/L (ref 98–111)
Creatinine, Ser: 0.98 mg/dL (ref 0.44–1.00)
GFR calc Af Amer: >60 mL/min (ref >60)
GFR calc non Af Amer: >60 mL/min (ref >60)
Glucose, Bld: 94 mg/dL (ref 70–99)
Potassium: 3.7 mmol/L (ref 3.5–5.1)
Sodium: 137 mmol/L (ref 135–145)

## 2019-10-04 MED ORDER — FUROSEMIDE 40 MG PO TABS: 40.0000 mg | ORAL_TABLET | ORAL | 6 refills | Status: AC | PRN

## 2019-10-04 NOTE — Patient Instructions (Signed)
STOP Digoxin  CHANGE Lasix to AS NEEDED for weight gain or SOB  Labs today We will only contact you if something comes back abnormal or we need to make some changes. Otherwise no news is good news!  Your physician recommends that you schedule a follow-up appointment in: 6 months with Dr Haroldine Laws  Do the following things EVERYDAY: 1) Weigh yourself in the morning before breakfast. Write it down and keep it in a log. 2) Take your medicines as prescribed 3) Eat low salt foods-Limit salt (sodium) to 2000 mg per day.  4) Stay as active as you can everyday 5) Limit all fluids for the day to less than 2 liters  At the [facility], you and your health needs are our priority. As part of our continuing mission to provide you with exceptional heart care, we have created designated Provider Care Teams. These Care Teams include your primary Cardiologist (physician) and Advanced Practice Providers (APPs- Physician Assistants and Nurse Practitioners) who all work together to provide you with the care you need, when you need it.   You may see any of the following providers on your designated Care Team at your next follow up: Marland Kitchen Dr Glori Bickers . Dr Loralie Champagne . Darrick Grinder, NP . Lyda Jester, PA   Please be sure to bring in all your medications bottles to every appointment.

## 2019-10-04 NOTE — Progress Notes (Signed)
ADVANCED HF CLINIC NOTE  PCP: None  Primary HF Cardiologist: Dr Gala Romney  HPI: 48 y/o woman with h/o ETOH/tobacco use and undiagnosed severe HTN.   Transferred from [facility]  On 06/18/19 by Dr. Tomie China with new onset systolic HF EF 15-20% on 06/18/19. Diuresed well from 157 pounds to 147 pounds. Underwent R/L cath on 06/20/19. Normal coronaries. RHC as below. Initially felt to be possible ETOH CM but LV not that dilated and RV ok. While in hospital BP very high and meds titrated. Felt to be HTN cardiomyopathy. Cardiac MRI done on the morning of d/c and EF 21%  Today she returns for HF follow up. Feels really good. No SOB, CP, orthopnea or PND. Taking all meds as prescribed.   Cardiac Testing ECHO 7/20202 LVEF 15-20 %. The cavity size was normal. Left ventricular diastolic Doppler parameters are consistent with pseudonormalization. RV normal systolic function.  CMRI 7/11/20202 1.  Moderately dilated LV with EF 21%, diffuse hypokinesis. 2. Mildly dilated RV with moderately decreased systolic function, EF 32% 3. No myocardial LGE, so no definitive evidence for prior MI, infiltrative disease, or myocarditis. RHC/LHC 06/2019 Ao = 133/97 (114) LV = 130/22 RA = 2 RV = 43/2 PA = 43/21 (31) PCW = 20 Fick cardiac output/index =4.4/2.5 PVR = 2.5 WU SVR = 2046 Ao sat = 97% PA sat = 71%, 71% Assessment: 1. Normal coronary arteries 2. 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 Mild to moderately depressed CO 4. Filling pressures much improved but SVR very high    ROS: All systems negative except as listed in HPI, PMH and Problem List.  SH:  Social History   Socioeconomic History  . Marital status: Married    Spouse name: Not on file  . Number of children: Not on file  . Years of education: Not on file  . Highest education level: Not on file  Occupational History  . Not on file  Social Needs  . Financial resource strain: Not hard at all  . Food insecurity    Worry: Never true   Inability: Never true  . Transportation needs    Medical: No    Non-medical: No  Tobacco Use  . Smoking status: Former Smoker    Types: Cigarettes    Quit date: 04/17/2019    Years since quitting: 0.4  . Smokeless tobacco: Never Used  Substance and Sexual Activity  . Alcohol use: Not Currently    Alcohol/week: 8.0 standard drinks    Types: 8 Cans of beer per week    Comment: quit 5/20  . Drug use: Never  . Sexual activity: Not on file  Lifestyle  . Physical activity    Days per week: 0 days    Minutes per session: Not on file  . Stress: Not at all  Relationships  . Social Musician on phone: Not on file    Gets together: Not on file    Attends religious service: Not on file    Active member of club or organization: Not on file    Attends meetings of clubs or organizations: Never    Relationship status: Married  . Intimate partner violence    Fear of current or ex partner: No    Emotionally abused: No    Physically abused: No    Forced sexual activity: No  Other Topics Concern  . Not on file  Social History Narrative  . Not on file    FH:  Family History  Problem  Relation Age of Onset  . Diabetes Mother   . Heart disease Father   . Heart disease Brother     Past Medical History:  Diagnosis Date  . Acute systolic (congestive) heart failure ([place])    Onset 06/2019  . Alcohol abuse    6 beers every other day x 8 years   . Tobacco abuse     Current Outpatient Medications  Medication Sig Dispense Refill  . carvedilol (COREG) 6.25 MG tablet Take 1 tablet (6.25 mg total) by mouth 2 (two) times daily. 180 tablet 3  . digoxin (LANOXIN) 0.125 MG tablet Take 1 tablet (0.125 mg total) by mouth daily. 30 tablet 6  . furosemide (LASIX) 40 MG tablet Take 1 tablet (40 mg total) by mouth daily. 30 tablet 6  . isosorbide-hydrALAZINE (BIDIL) 20-37.5 MG tablet Take 2 tablets by mouth 3 (three) times daily. 270 tablet 3  . sacubitril-valsartan (ENTRESTO) 97-103 MG  Take 1 tablet by mouth 2 (two) times daily. 60 tablet 6  . spironolactone (ALDACTONE) 25 MG tablet Take 1 tablet (25 mg total) by mouth daily. 30 tablet 6   No current facility-administered medications for this encounter.     Vitals:   10/04/19 1411  BP: 120/67  Pulse: 75  SpO2: 99%  Weight: 72.3 kg (159 lb 6.4 oz)   Wt Readings from Last 3 Encounters:  10/04/19 72.3 kg (159 lb 6.4 oz)  08/27/19 72.8 kg (160 lb 9.6 oz)  08/22/19 74.5 kg (164 lb 3.2 oz)    PHYSICAL EXAM: General:  Well appearing. No resp difficulty HEENT: normal Neck: supple. no JVD. Carotids 2+ bilat; no bruits. No lymphadenopathy or thryomegaly appreciated. Cor: PMI nondisplaced. Regular rate & rhythm. No rubs, gallops or murmurs. Lungs: clear Abdomen: soft, nontender, nondistended. No hepatosplenomegaly. No bruits or masses. Good bowel sounds. Extremities: no cyanosis, clubbing, rash, edema Neuro: alert & orientedx3, cranial nerves grossly intact. moves all 4 extremities w/o difficulty. Affect pleasant  ASSESSMENT & PLAN: 1. Acute systolic HF - suspect HTN CM - echo at [facility] 7/6 showed EF 15% with mild RV dysfunction - echo 15-25 with mild to mod MR  - cath 7/8 with normal cors.  - CMRI EF 21%. No evidence infiltrative disease.  - Echo today EF 55-60% (complete recovery) - suspect HTN cardiomyopathy - NYHA I.  Volume status looks good.   - Change lasix to prn only - Continue 25 mg spironolactone daily - Continue coreg 6.25 mg twice a day.  - Stop digoxin - Continue entresto 97-103 twice a day.   - Continue bidil to 2 tab bidil three times a day.  - Check labs today  2. H/o ETOH abuse - quit in May - Remains quit   3. HTN - Blood pressure well controlled. Continue current regimen.  4.Tobacco abuse She has not been smoking. Congratulated.     Glori Bickers  MD 3:01 PM

## 2019-10-07 IMAGING — MR MR CARDIA MORPHOLOGY WITHOUT AND WITH CONTRAST
11 of 13 series · 38 of 40 positions shown · IV contrast (Gadavist)
Comparison: none

CLINICAL DATA: Cardiomyopathy of uncertain etiology.

EXAM:
CARDIAC MRI
TECHNIQUE: The patient was scanned on a 1.5 Tesla GE magnet. A dedicated
cardiac coil was used. Functional imaging was done using Fiesta
sequences. [DATE], and 4 chamber views were done to assess for RWMA's.
Modified Kyrtap rule using a short axis stack was used to
calculate an ejection fraction on a dedicated work station using
Circle software. The patient received 6 cc of Gadavist. After 10
minutes inversion recovery sequences were used to assess for
infiltration and scar tissue.

[Series 6: bSSFP · sagittal · 8.0mm · 1.43mm/px · 25 of 350 slices shown (1 of 5)]
[im 1/350]
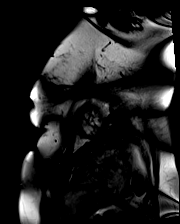
[im 15/350]
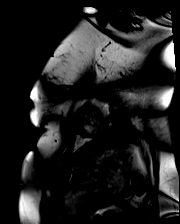
[im 30/350]
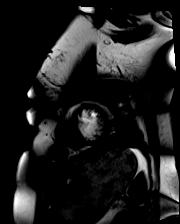
[im 44/350]
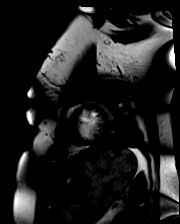
[im 59/350]
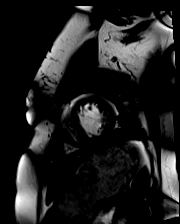
[im 73/350]
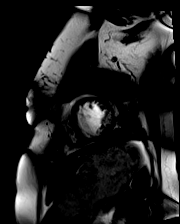
[im 88/350]
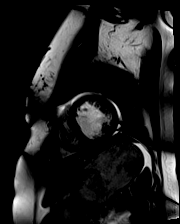
[im 102/350]
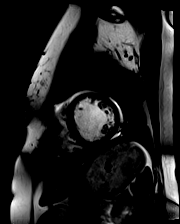
[im 117/350]
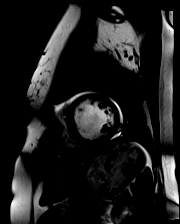
[im 131/350]
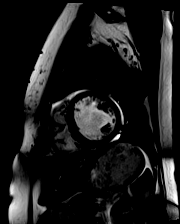
[im 146/350]
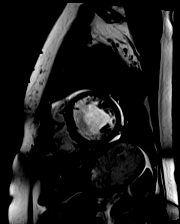
[im 160/350]
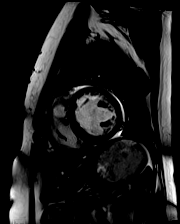
[im 175/350]
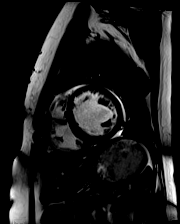
[im 190/350]
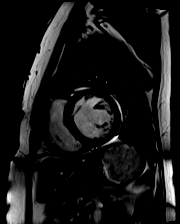
[im 204/350]
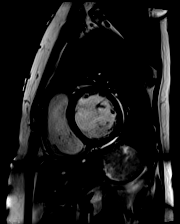
[im 219/350]
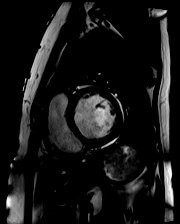
[im 233/350]
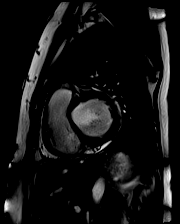
[im 248/350]
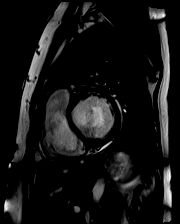
[im 262/350]
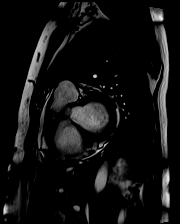
[im 277/350]
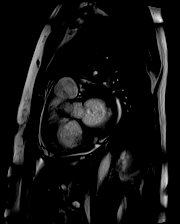
[im 291/350]
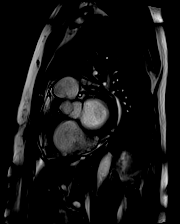
[im 306/350]
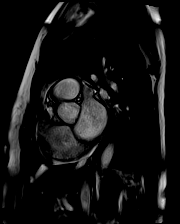
[im 320/350]
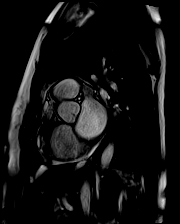
[im 335/350]
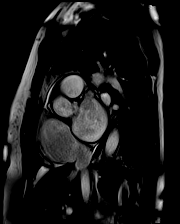
[im 350/350]
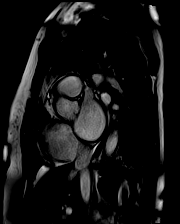

[Series 7: t2_stir_db_sax · sagittal · 8.0mm · 1.73mm/px · 1 of 14 slices shown]
[im 1/14]
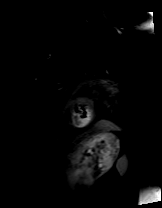

[Series 8: t2_stir_db_radial ((date)ch) · axial · 6.0mm · 1.54mm/px · 1 of 3 slices shown]
[im 1/3]
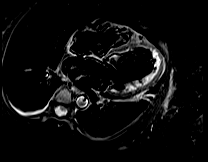

[Series 9: bSSFP · oblique · 6.0mm · 1.25mm/px · 1 of 25 slices shown (2 of 5)]
[im 1/25]
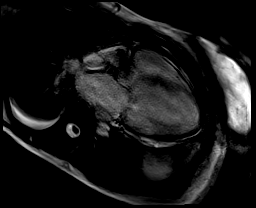

[Series 13: lge_single shot sa · sagittal · 8.0mm · 1.67mm/px · 1 of 14 slices shown (1 of 2)]
[im 1/14]
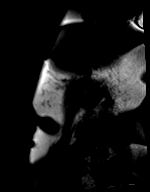

[Series 14: lge_single shot sa · sagittal · 8.0mm · 1.67mm/px · 1 of 14 slices shown (2 of 2)]
[im 1/14]
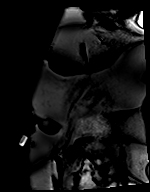

[Series 22: lge short axis_mag · sagittal · 8.0mm · 1.43mm/px · 1 of 12 slices shown]
[im 1/12]
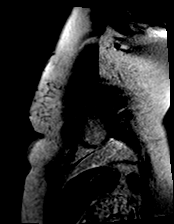

[Series 23: lge short axis_psir · sagittal · 8.0mm · 1.43mm/px · 1 of 12 slices shown]
[im 1/12]
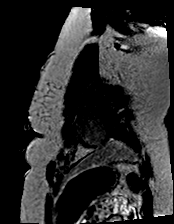

[Series 30: bSSFP · oblique · 6.0mm · 1.25mm/px · 2 of 25 slices shown (3 of 5)]
[im 1/25]
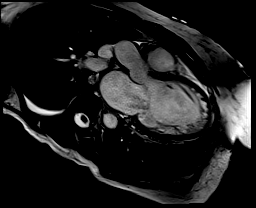
[im 25/25]
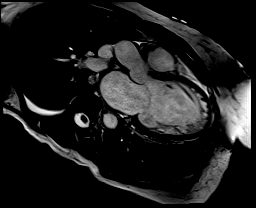

[Series 31: bSSFP · oblique · 6.0mm · 1.25mm/px · 2 of 25 slices shown (4 of 5)]
[im 1/25]
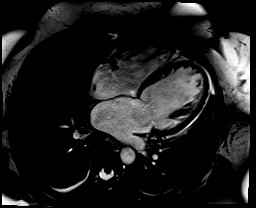
[im 25/25]
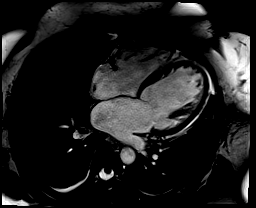

[Series 32: bSSFP · coronal · 6.0mm · 1.25mm/px · 2 of 25 slices shown (5 of 5)]
[im 1/25]
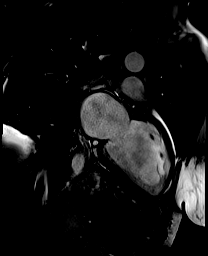
[im 25/25]
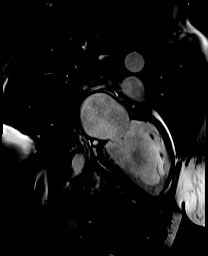

[38 of 40 positions shown; findings below may reference images not displayed]

FINDINGS: Limited images of the lung fields showed no gross abnormalities.

The left ventricle was moderately dilated with normal wall
thickness, EF 21% with diffuse hypokinesis. No LV thrombus noted.
The right ventricle was mildly dilated, moderate systolic
dysfunction with EF 32%. Moderate left atrial enlargement. Mild
right atrial enlargement. There appeared to be moderate mitral
regurgitation. Trileaflet aortic valve with no significant stenosis
or regurgitation.

On delayed enhancement images, there was no myocardial late
gadolinium enhancement (LGE).

Measurements:

LVEDV 242 mL

LVSV 52 mL

LVEF 21%

RVEDV 186 mL

RVSV 59 mL

RVEF 32%
IMPRESSION: 1.  Moderately dilated LV with EF 21%, diffuse hypokinesis.

2. Mildly dilated RV with moderately decreased systolic function, EF
32%.

3. No myocardial LGE, so no definitive evidence for prior MI,
infiltrative disease, or myocarditis.

Pory Rijan
